# Patient Record
Sex: Female | Born: 1980 | Race: White | Hispanic: No | Marital: Married | Smoking: Former smoker
Health system: Southern US, Community
[De-identification: ages and names within clinical notes are randomized; demographics above are authoritative.]

## PROBLEM LIST (undated history)

## (undated) DIAGNOSIS — M545 Low back pain, unspecified: Secondary | ICD-10-CM

## (undated) DIAGNOSIS — F909 Attention-deficit hyperactivity disorder, unspecified type: Secondary | ICD-10-CM

## (undated) DIAGNOSIS — G629 Polyneuropathy, unspecified: Secondary | ICD-10-CM

## (undated) DIAGNOSIS — M1990 Unspecified osteoarthritis, unspecified site: Secondary | ICD-10-CM

## (undated) DIAGNOSIS — M436 Torticollis: Secondary | ICD-10-CM

## (undated) DIAGNOSIS — G43909 Migraine, unspecified, not intractable, without status migrainosus: Secondary | ICD-10-CM

## (undated) HISTORY — DX: Low back pain: M54.5

## (undated) HISTORY — DX: Attention-deficit hyperactivity disorder, unspecified type: F90.9

## (undated) HISTORY — DX: Low back pain, unspecified: M54.50

## (undated) HISTORY — PX: CYSTOSCOPY: SUR368

---

## 2004-07-26 ENCOUNTER — Emergency Department: Payer: Self-pay | Admitting: Emergency Medicine

## 2004-10-08 ENCOUNTER — Ambulatory Visit: Payer: Self-pay | Admitting: Specialist

## 2005-07-30 ENCOUNTER — Encounter: Admission: RE | Admit: 2005-07-30 | Discharge: 2005-07-30 | Payer: Self-pay | Admitting: Unknown Physician Specialty

## 2005-11-25 ENCOUNTER — Ambulatory Visit: Payer: Self-pay | Admitting: Pain Medicine

## 2005-12-01 ENCOUNTER — Ambulatory Visit: Payer: Self-pay | Admitting: Pain Medicine

## 2005-12-02 ENCOUNTER — Ambulatory Visit: Payer: Self-pay | Admitting: Pain Medicine

## 2005-12-10 ENCOUNTER — Ambulatory Visit: Payer: Self-pay | Admitting: Pain Medicine

## 2005-12-21 ENCOUNTER — Observation Stay: Payer: Self-pay

## 2005-12-21 ENCOUNTER — Other Ambulatory Visit: Payer: Self-pay

## 2009-09-25 ENCOUNTER — Inpatient Hospital Stay: Payer: Self-pay | Admitting: Psychiatry

## 2010-07-13 HISTORY — PX: CERVICAL FUSION: SHX112

## 2010-08-07 ENCOUNTER — Ambulatory Visit: Payer: Self-pay | Admitting: Internal Medicine

## 2010-08-11 ENCOUNTER — Ambulatory Visit: Payer: Self-pay | Admitting: Unknown Physician Specialty

## 2010-08-12 ENCOUNTER — Ambulatory Visit: Payer: Self-pay | Admitting: Unknown Physician Specialty

## 2011-03-10 ENCOUNTER — Emergency Department: Payer: Self-pay | Admitting: *Deleted

## 2013-10-05 ENCOUNTER — Ambulatory Visit: Payer: Self-pay | Admitting: Adult Health

## 2013-12-29 ENCOUNTER — Other Ambulatory Visit: Payer: Self-pay | Admitting: Internal Medicine

## 2013-12-29 ENCOUNTER — Ambulatory Visit (INDEPENDENT_AMBULATORY_CARE_PROVIDER_SITE_OTHER)
Admission: RE | Admit: 2013-12-29 | Discharge: 2013-12-29 | Disposition: A | Discharge status: Home / Self Care | Payer: Self-pay | Source: Ambulatory Visit | Attending: Internal Medicine | Admitting: Internal Medicine

## 2013-12-29 DIAGNOSIS — M79609 Pain in unspecified limb: Secondary | ICD-10-CM

## 2013-12-29 DIAGNOSIS — M79632 Pain in left forearm: Secondary | ICD-10-CM

## 2013-12-29 MED ORDER — TRAMADOL HCL 50 MG PO TABS: 50.0000 mg | ORAL_TABLET | Freq: Three times a day (TID) | ORAL | Status: DC | PRN

## 2014-07-02 ENCOUNTER — Other Ambulatory Visit: Payer: Self-pay | Admitting: Neurosurgery

## 2014-07-02 DIAGNOSIS — Z981 Arthrodesis status: Secondary | ICD-10-CM

## 2014-07-02 DIAGNOSIS — M4722 Other spondylosis with radiculopathy, cervical region: Secondary | ICD-10-CM

## 2014-07-12 ENCOUNTER — Ambulatory Visit
Admission: RE | Admit: 2014-07-12 | Discharge: 2014-07-12 | Disposition: A | Discharge status: Home / Self Care | Payer: BC Managed Care – PPO | Source: Ambulatory Visit | Attending: Neurosurgery | Admitting: Neurosurgery

## 2014-07-12 DIAGNOSIS — Z981 Arthrodesis status: Secondary | ICD-10-CM

## 2014-07-12 DIAGNOSIS — M4722 Other spondylosis with radiculopathy, cervical region: Secondary | ICD-10-CM

## 2014-08-16 ENCOUNTER — Other Ambulatory Visit: Payer: Self-pay | Admitting: Nurse Practitioner

## 2014-08-16 MED ORDER — GABAPENTIN 300 MG PO CAPS: 300.0000 mg | ORAL_CAPSULE | Freq: Three times a day (TID) | ORAL | Status: DC

## 2014-10-26 ENCOUNTER — Other Ambulatory Visit: Payer: Self-pay | Admitting: Internal Medicine

## 2014-10-26 MED ORDER — CIPROFLOXACIN HCL 500 MG PO TABS: 500.0000 mg | ORAL_TABLET | Freq: Two times a day (BID) | ORAL | Status: DC

## 2014-12-18 ENCOUNTER — Telehealth: Payer: Self-pay

## 2014-12-18 NOTE — Telephone Encounter (Signed)
Spoke with patient and she said she did not missed an appt because her next visit wasn't until some time in June. She will call back to schedule an appt.

## 2014-12-18 NOTE — Telephone Encounter (Signed)
Denied, no showed for last appt; needs visit

## 2014-12-26 ENCOUNTER — Ambulatory Visit: Payer: Self-pay | Admitting: Family Medicine

## 2014-12-26 ENCOUNTER — Encounter: Payer: Self-pay | Admitting: Family Medicine

## 2014-12-26 ENCOUNTER — Ambulatory Visit (INDEPENDENT_AMBULATORY_CARE_PROVIDER_SITE_OTHER): Payer: BLUE CROSS/BLUE SHIELD | Admitting: Family Medicine

## 2014-12-26 VITALS — BP 116/76 | HR 83 | Temp 98.4°F | Wt 139.0 lb

## 2014-12-26 DIAGNOSIS — M5412 Radiculopathy, cervical region: Secondary | ICD-10-CM | POA: Diagnosis not present

## 2014-12-26 DIAGNOSIS — F900 Attention-deficit hyperactivity disorder, predominantly inattentive type: Secondary | ICD-10-CM | POA: Diagnosis not present

## 2014-12-26 DIAGNOSIS — Z3042 Encounter for surveillance of injectable contraceptive: Secondary | ICD-10-CM | POA: Diagnosis not present

## 2014-12-26 DIAGNOSIS — Z30013 Encounter for initial prescription of injectable contraceptive: Secondary | ICD-10-CM

## 2014-12-26 DIAGNOSIS — F909 Attention-deficit hyperactivity disorder, unspecified type: Secondary | ICD-10-CM

## 2014-12-26 DIAGNOSIS — M545 Low back pain, unspecified: Secondary | ICD-10-CM | POA: Insufficient documentation

## 2014-12-26 LAB — PREGNANCY, URINE: Preg Test, Ur: NEGATIVE

## 2014-12-26 MED ORDER — MEDROXYPROGESTERONE ACETATE 150 MG/ML IM SUSP
150.0000 mg | Freq: Once | INTRAMUSCULAR | Status: AC
  Administered 2014-12-26: 150 mg via INTRAMUSCULAR

## 2014-12-26 MED ORDER — AMPHETAMINE-DEXTROAMPHETAMINE 10 MG PO TABS: 10.0000 mg | ORAL_TABLET | Freq: Three times a day (TID) | ORAL | Status: DC

## 2014-12-26 MED ORDER — GABAPENTIN 100 MG PO CAPS: ORAL_CAPSULE | ORAL | Status: DC

## 2014-12-26 NOTE — Assessment & Plan Note (Signed)
Continue current meds; refills given today

## 2014-12-26 NOTE — Progress Notes (Signed)
BP 116/76 mmHg  Pulse 83  Temp(Src) 98.4 F (36.9 C)  Wt 139 lb (63.05 kg)  SpO2 100%   Subjective:    Patient ID: Amber Santana, female    DOB: 1981-03-12, 34 y.o.   MRN: [id number]  HPI: Amber Santana is a 34 y.o. female  Chief Complaint  Patient presents with  . ADHD  . Depo   ADHD On medication except has run out; can tell a big difference, trouble concentrating, in other people's conversation No tics, no tremors, no headaches, no internal anxiety She has taken this medicine for a long time Satisfied with current dose Her daughter took med for ADHD once but it cause abd pain so she can't take it  Depo use for contraception She is pleased with how this is working; urine pregnancy test today was negative  She says the gabapentin is working really well she'd like to stay on that; she takes 100 mg in the morning and 300 mg at bedtime; she is not eager to have surgery  Relevant past medical, surgical, family and social history reviewed and updated as indicated. Interim medical history since our last visit reviewed. Allergies and medications reviewed and updated.  Review of Systems  Cardiovascular: Negative for palpitations.  Genitourinary: Negative for vaginal bleeding.  Neurological: Negative for tremors and headaches.  Psychiatric/Behavioral: The patient is not nervous/anxious.    Per HPI unless specifically indicated above     Objective:    BP 116/76 mmHg  Pulse 83  Temp(Src) 98.4 F (36.9 C)  Wt 139 lb (63.05 kg)  SpO2 100%  Wt Readings from Last 3 Encounters:  12/26/14 139 lb (63.05 kg)  10/05/14 137 lb (62.143 kg)    Physical Exam  Constitutional: She appears well-developed and well-nourished. No distress.  HENT:  Head: Normocephalic and atraumatic.  Eyes: EOM are normal. No scleral icterus.  Neck: No thyromegaly present.  Surgical scar left anterior neck  Cardiovascular: Normal rate, regular rhythm and normal heart sounds.   No murmur  heard. Pulmonary/Chest: Effort normal and breath sounds normal. No respiratory distress. She has no wheezes.  Abdominal: Soft. She exhibits no distension.  Musculoskeletal: Normal range of motion. She exhibits no edema.  Neurological: She is alert. She exhibits normal muscle tone.  Normal gait, no loss of muscle bulk in RUE  Skin: Skin is warm and dry. She is not diaphoretic. No pallor.  Psychiatric: She has a normal mood and affect. Her behavior is normal. Judgment and thought content normal.    No results found for this or any previous visit.    Assessment & Plan:   Problem List Items Addressed This Visit      Nervous and Auditory   Cervical radiculopathy, chronic   Relevant Medications   amphetamine-dextroamphetamine (ADDERALL) 10 MG tablet   amphetamine-dextroamphetamine (ADDERALL) 10 MG tablet   amphetamine-dextroamphetamine (ADDERALL) 10 MG tablet   gabapentin (NEURONTIN) 100 MG capsule     Other   Adult ADHD (attention deficit hyperactivity disorder)    Continue current meds; refills given today      Depo-Provera contraceptive status    Other Visit Diagnoses    Surveillance of contraceptive injection    -  Primary    Relevant Medications    medroxyPROGESTERone (DEPO-PROVERA) injection 150 mg    Other Relevant Orders    Pregnancy, urine        Follow up plan: Return in about 3 months (around 03/28/2015) for medicines, also for physical when convenient.

## 2014-12-26 NOTE — Patient Instructions (Signed)
Return in 3 months for medication and Depo injection Return for complete physical in the next few months (separate appointments, sorry)

## 2015-03-14 ENCOUNTER — Other Ambulatory Visit: Payer: Self-pay | Admitting: Internal Medicine

## 2015-03-14 DIAGNOSIS — H6692 Otitis media, unspecified, left ear: Secondary | ICD-10-CM

## 2015-03-14 MED ORDER — PREDNISONE 10 MG PO TABS: ORAL_TABLET | ORAL | Status: DC

## 2015-03-14 MED ORDER — AMOXICILLIN-POT CLAVULANATE 875-125 MG PO TABS: 1.0000 | ORAL_TABLET | Freq: Two times a day (BID) | ORAL | Status: DC

## 2015-03-20 ENCOUNTER — Encounter: Payer: Self-pay | Admitting: Family Medicine

## 2015-03-20 ENCOUNTER — Ambulatory Visit (INDEPENDENT_AMBULATORY_CARE_PROVIDER_SITE_OTHER): Payer: BLUE CROSS/BLUE SHIELD | Admitting: Family Medicine

## 2015-03-20 VITALS — BP 126/83 | HR 76 | Temp 97.8°F | Ht 64.5 in | Wt 139.0 lb

## 2015-03-20 DIAGNOSIS — Z3042 Encounter for surveillance of injectable contraceptive: Secondary | ICD-10-CM | POA: Diagnosis not present

## 2015-03-20 DIAGNOSIS — F900 Attention-deficit hyperactivity disorder, predominantly inattentive type: Secondary | ICD-10-CM | POA: Diagnosis not present

## 2015-03-20 DIAGNOSIS — F909 Attention-deficit hyperactivity disorder, unspecified type: Secondary | ICD-10-CM

## 2015-03-20 DIAGNOSIS — M5412 Radiculopathy, cervical region: Secondary | ICD-10-CM | POA: Diagnosis not present

## 2015-03-20 MED ORDER — AMPHETAMINE-DEXTROAMPHETAMINE 12.5 MG PO TABS: 12.5000 mg | ORAL_TABLET | Freq: Three times a day (TID) | ORAL | Status: DC

## 2015-03-20 MED ORDER — MEDROXYPROGESTERONE ACETATE 150 MG/ML IM SUSP
150.0000 mg | Freq: Once | INTRAMUSCULAR | Status: AC
  Administered 2015-03-20: 150 mg via INTRAMUSCULAR

## 2015-03-20 NOTE — Assessment & Plan Note (Signed)
Doing well with current regimen of gabapentin

## 2015-03-20 NOTE — Patient Instructions (Addendum)
Be aware that consuming caffeine plus taking the stimulants is a BAD idea, so cut out the energy drinks The stimulant medicine carries a small but possible risk of heart attack, and combining that with Sudafed and/or energy drinks only increases your risk Increase the Adderall and see how that works for you, call me in a couple of weeks with an update Have your blood pressure and heart rate and weight checked at your office once a week for the next four weeks and let me know what those readings are before your next refill is due Return in 13 weeks for Depo and ADHD follow-up Flu shots are recommended every fall

## 2015-03-20 NOTE — Progress Notes (Signed)
BP 126/83 mmHg  Pulse 76  Temp(Src) 97.8 F (36.6 C)  Ht 5' 4.5" (1.638 m)  Wt 139 lb (63.05 kg)  BMI 23.50 kg/m2  SpO2 98%   Subjective:    Patient ID: Amber Santana, female    DOB: Aug 22, 1980, 34 y.o.   MRN: [id number]  HPI: Amber Santana is a 34 y.o. female  Chief Complaint  Patient presents with  . ADHD  . Contraception    Due for Depo injection    She is doing well on her medicine; there are things she can do better on her medicine; staying on task definitely is helped by being on medicine, able to focus and stay on task; no adverse side effects; weight stable; no headaches, not jittery; she thinks she needs the dose increased; she does not know if she has gotten used to the same dose, has been on the dose for years; not able to focus as well or for as long; ; tongue swelling, but no thrush, no throat closing up, no trouble breathing  She took sudafed today; was in the bed all weekend with sinus junk; throat irritated; sinus congestion; never had strep throat; 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 Interim medical history since our last visit reviewed. Allergies and medications reviewed and updated.  Review of Systems Per HPI unless specifically indicated above     Objective:    BP 126/83 mmHg  Pulse 76  Temp(Src) 97.8 F (36.6 C)  Ht 5' 4.5" (1.638 m)  Wt 139 lb (63.05 kg)  BMI 23.50 kg/m2  SpO2 98%  Wt Readings from Last 3 Encounters:  03/20/15 139 lb (63.05 kg)  12/26/14 139 lb (63.05 kg)  10/05/14 137 lb (62.143 kg)    Physical Exam   Constitutional: She appears well-developed and well-nourished. No distress.  HENT:  Head: Normocephalic and atraumatic.  Right Ear: Hearing, tympanic membrane, external ear and ear canal normal. Tympanic membrane is not erythematous. No middle ear effusion.  Left Ear: Hearing, tympanic membrane, external ear and ear canal normal. Tympanic membrane is not erythematous.  No middle ear effusion.  Nose: No rhinorrhea.  Mouth/Throat: Oropharynx is clear and moist and mucous membranes are normal. No oral lesions. No oropharyngeal exudate, posterior oropharyngeal edema or posterior oropharyngeal erythema.  No swelling of tongue  Eyes: EOM are normal. No scleral icterus.  Neck: No thyromegaly present.  Cardiovascular: Normal rate, regular rhythm and normal heart sounds.   No murmur heard. Pulmonary/Chest: Effort normal and breath sounds normal. No respiratory distress. She has no wheezes.  Abdominal: Soft. Bowel sounds are normal. She exhibits no distension.  Musculoskeletal: Normal range of motion. She exhibits no edema.  Neurological: She is alert. She displays no tremor. She exhibits normal muscle tone. Gait normal.  Reflex Scores:      Patellar reflexes are 2+ on the right side and 2+ on the left side. Skin: Skin is warm and dry. No ecchymosis and no rash noted. She is not diaphoretic. No erythema. No pallor.  Psychiatric: She has a normal mood and affect. Her behavior is normal. Judgment and  thought content normal.    Results for orders placed or performed in visit on 12/26/14  Pregnancy, urine  Result Value Ref Range   Preg Test, Ur Negative Negative      Assessment & Plan:   Problem List Items Addressed This Visit      Nervous and Auditory   Cervical radiculopathy, chronic    Doing well with current regimen of gabapentin      Relevant Medications   amphetamine-dextroamphetamine (ADDERALL) 12.5 MG tablet     Other   Adult ADHD (attention deficit hyperactivity disorder) -  Primary    No adverse effects of medicine; patient's use of energy drinks suggest she may be attempting to self-medicate; discussed risk of heart attack when combining stimulants plus sudafed plus energy drinks; avoid excessive caffeine and any other stimulant whlie on medicine; I will increase her adderall to 12.5 mg TID; patient to monitor her BP, pulse, weight (she works at [employer] office); once a week for 4 weeks and contact me with results; if working well at that time without adversely affecting vitals, will continue until next appt in 13 weeks      Depo-Provera contraceptive status   Relevant Medications   medroxyPROGESTERone (DEPO-PROVERA) injection 150 mg      Not pharmacy shopping; patient may talk to pharmacist; may take Rx to another pharmacy in case sensitive to ingredient We reviewed all of her previous labs  Follow up plan: Return in about 13 weeks (around 06/19/2015) for depo and ADHD.

## 2015-03-20 NOTE — Assessment & Plan Note (Signed)
No adverse effects of medicine; patient's use of energy drinks suggest she may be attempting to self-medicate; discussed risk of heart attack when combining stimulants plus sudafed plus energy drinks; avoid excessive caffeine and any other stimulant whlie on medicine; I will increase her adderall to 12.5 mg TID; patient to monitor her BP, pulse, weight (she works at [employer] office); once a week for 4 weeks and contact me with results; if working well at that time without adversely affecting vitals, will continue until next appt in 13 weeks

## 2015-04-18 ENCOUNTER — Telehealth: Payer: Self-pay | Admitting: Family Medicine

## 2015-04-18 NOTE — Telephone Encounter (Signed)
Pt called stated during her last visit Dr. Sherie Don increased her Aderrall. This is doing well. Stated she needs to pick up the other 2 prescriptions. Please call pt when this is ready for pick up. Thanks. Pt would like these done by tomorrow. Pt's husband is going to pick up the RX Gus Rankin)

## 2015-04-18 NOTE — Telephone Encounter (Signed)
Please see last office visit; she was supposed to call me with her weight, pulse, blood pressure readings; I need those first to make sure higher dose is appropriate and not causing harm Our agreement was that she wouldn't have to come back in to see [place] if she'd check those things and let me know

## 2015-04-18 NOTE — Telephone Encounter (Signed)
Routing to provider  

## 2015-04-19 MED ORDER — AMPHETAMINE-DEXTROAMPHETAMINE 12.5 MG PO TABS: 12.5000 mg | ORAL_TABLET | Freq: Three times a day (TID) | ORAL | Status: DC

## 2015-04-19 NOTE — Telephone Encounter (Signed)
Patient called back, her BP is 110/72, pulse is 76. Weight 141. She gets off work today at [employer] and would like to pick up her rx this afternoon.

## 2015-04-19 NOTE — Telephone Encounter (Signed)
Rx ready; thank you

## 2015-04-19 NOTE — Telephone Encounter (Signed)
Patient will have her vitals and weight checked and call me back with those readings.

## 2015-06-04 ENCOUNTER — Other Ambulatory Visit: Payer: Self-pay | Admitting: Family Medicine

## 2015-06-04 NOTE — Telephone Encounter (Signed)
Pt needs Adderal

## 2015-06-05 MED ORDER — AMPHETAMINE-DEXTROAMPHETAMINE 12.5 MG PO TABS: 12.5000 mg | ORAL_TABLET | Freq: Three times a day (TID) | ORAL | Status: DC

## 2015-06-05 NOTE — Telephone Encounter (Signed)
Dr. Laural BenesJohnson, I am off (vacation) today and patient is requesting refill of ADHD medicine I reviewed her last office visit and phone note She is compliant; no red flags I checked NCCSRS and the Rx written on 04/19/15 by me was filled 04/29/15; this is appropriate Would you mind providing a prescription for just 21 pills (enough for one week) and I'll provide further Rx when I return next week? Thank you so much

## 2015-06-05 NOTE — Telephone Encounter (Signed)
Routing to provider  

## 2015-06-11 MED ORDER — AMPHETAMINE-DEXTROAMPHETAMINE 12.5 MG PO TABS: 12.5000 mg | ORAL_TABLET | Freq: Three times a day (TID) | ORAL | Status: DC

## 2015-06-11 NOTE — Telephone Encounter (Signed)
New Rx written; signed and up front

## 2015-06-11 NOTE — Addendum Note (Signed)
Addended by: Chisum Habenicht, Janit BernMELINDA P on: 06/11/2015 01:05 PM   Modules accepted: Orders

## 2015-06-20 ENCOUNTER — Ambulatory Visit (INDEPENDENT_AMBULATORY_CARE_PROVIDER_SITE_OTHER): Payer: BLUE CROSS/BLUE SHIELD

## 2015-06-20 DIAGNOSIS — Z308 Encounter for other contraceptive management: Secondary | ICD-10-CM | POA: Diagnosis not present

## 2015-06-20 MED ORDER — MEDROXYPROGESTERONE ACETATE 150 MG/ML IM SUSP
150.0000 mg | Freq: Once | INTRAMUSCULAR | Status: AC
  Administered 2015-06-20: 150 mg via INTRAMUSCULAR

## 2015-06-21 ENCOUNTER — Encounter: Payer: BLUE CROSS/BLUE SHIELD | Admitting: Family Medicine

## 2015-09-05 ENCOUNTER — Ambulatory Visit: Payer: BLUE CROSS/BLUE SHIELD

## 2015-09-05 ENCOUNTER — Encounter: Payer: Self-pay | Admitting: Family Medicine

## 2015-09-05 ENCOUNTER — Ambulatory Visit (INDEPENDENT_AMBULATORY_CARE_PROVIDER_SITE_OTHER): Payer: Managed Care, Other (non HMO) | Admitting: Family Medicine

## 2015-09-05 VITALS — BP 121/75 | HR 73 | Temp 98.8°F | Ht 64.25 in | Wt 141.6 lb

## 2015-09-05 DIAGNOSIS — N898 Other specified noninflammatory disorders of vagina: Secondary | ICD-10-CM | POA: Diagnosis not present

## 2015-09-05 DIAGNOSIS — N760 Acute vaginitis: Secondary | ICD-10-CM | POA: Diagnosis not present

## 2015-09-05 DIAGNOSIS — Z3042 Encounter for surveillance of injectable contraceptive: Secondary | ICD-10-CM

## 2015-09-05 DIAGNOSIS — M5412 Radiculopathy, cervical region: Secondary | ICD-10-CM | POA: Diagnosis not present

## 2015-09-05 DIAGNOSIS — F900 Attention-deficit hyperactivity disorder, predominantly inattentive type: Secondary | ICD-10-CM | POA: Diagnosis not present

## 2015-09-05 DIAGNOSIS — Z6379 Other stressful life events affecting family and household: Secondary | ICD-10-CM

## 2015-09-05 DIAGNOSIS — F909 Attention-deficit hyperactivity disorder, unspecified type: Secondary | ICD-10-CM

## 2015-09-05 DIAGNOSIS — A499 Bacterial infection, unspecified: Secondary | ICD-10-CM | POA: Diagnosis not present

## 2015-09-05 DIAGNOSIS — N889 Noninflammatory disorder of cervix uteri, unspecified: Secondary | ICD-10-CM | POA: Insufficient documentation

## 2015-09-05 DIAGNOSIS — Z124 Encounter for screening for malignant neoplasm of cervix: Secondary | ICD-10-CM | POA: Insufficient documentation

## 2015-09-05 DIAGNOSIS — B9689 Other specified bacterial agents as the cause of diseases classified elsewhere: Secondary | ICD-10-CM

## 2015-09-05 LAB — WET PREP FOR TRICH, YEAST, CLUE
Clue Cell Exam: POSITIVE — AB
Trichomonas Exam: NEGATIVE
YEAST EXAM: NEGATIVE

## 2015-09-05 MED ORDER — METRONIDAZOLE 500 MG PO TABS: 500.0000 mg | ORAL_TABLET | Freq: Two times a day (BID) | ORAL | Status: DC

## 2015-09-05 MED ORDER — AMPHETAMINE-DEXTROAMPHETAMINE 12.5 MG PO TABS: 12.5000 mg | ORAL_TABLET | Freq: Three times a day (TID) | ORAL | Status: DC

## 2015-09-05 MED ORDER — GABAPENTIN 100 MG PO CAPS: ORAL_CAPSULE | ORAL | Status: DC

## 2015-09-05 NOTE — Assessment & Plan Note (Addendum)
Patient was not scheduled for a physical, but pap smear due; thin prep collected; however, abnormal appearance of cervix and presence of BV, erythema, discharge may render sampling error; refer to GYN

## 2015-09-05 NOTE — Assessment & Plan Note (Signed)
Refer to GYN; thin prep collected today but she had BV and fair amount of erythema, discharge, so would suggest to GYN that they consider repeating the pap smear if felt indicated, as infection or inflammation could have interfered with sampling

## 2015-09-05 NOTE — Progress Notes (Signed)
BP 121/75 mmHg  Pulse 73  Temp(Src) 98.8 F (37.1 C)  Ht 5' 4.25" (1.632 m)  Wt 141 lb 9.6 oz (64.229 kg)  BMI 24.12 kg/m2  SpO2 98%   Subjective:    Patient ID: Amber Santana, female    DOB: [birth year], 35 y.o.   MRN: [id number]  HPI: Amber Santana is a 35 y.o. female  Chief Complaint  Patient presents with  . Follow-up  . Contraception    Depo   Last pap smear was May 2013; health maintenance is now showing that is it overdue; she reports that she has never had an abnormal one; she thinks she might have a yeast infection; husband noticed discharge; some itching and burning; no periods at all on Depo; has had thick white cottage cheese discharge she reports; last pap smear report reviewed from 2013, no HPV done; no issues with headache or weight fluctuations; she has been under a lot of stress lately; some weight gain but stress  Last depo was given Dec 8th and next injection due between Feb 23rd and March 9th per Depo calendar  She has been under a lot of stress; her daughter is going to finish up competitive cheerleading in May and this end of an era is difficult for patient to comes to term with; she has a son who is a Holiday representative in high school; they tried her on lexapro, did not like how it make her feel; sleep is a whole other problem she explains  The neurontin is working well, that is a wonder drug she reports for her cervical radiculopathy  The adderall is doing well, wants to stay on same dose; long-standing dx and treatment of ADHD; she had a change in insurance and was not able to get it covered for a few months  Relevant past medical, surgical, family and social history reviewed and updated as indicated. Interim medical history since our last visit reviewed. Allergies and medications reviewed and updated.  Review of Systems Per HPI unless specifically indicated above     Objective:    BP 121/75 mmHg  Pulse 73  Temp(Src) 98.8 F (37.1 C)  Ht 5' 4.25" (1.632  m)  Wt 141 lb 9.6 oz (64.229 kg)  BMI 24.12 kg/m2  SpO2 98%  Wt Readings from Last 3 Encounters:  09/05/15 141 lb 9.6 oz (64.229 kg)  03/20/15 139 lb (63.05 kg)  12/26/14 139 lb (63.05 kg)    Physical Exam  Constitutional: She appears well-developed and well-nourished. No distress.  HENT:  Head: Normocephalic and atraumatic.  Eyes: EOM are normal. No scleral icterus.  Neck: No thyromegaly present.  Surgical scar left side of neck  Cardiovascular: Normal rate, regular rhythm and normal heart sounds.   No murmur heard. Pulmonary/Chest: Effort normal and breath sounds normal. No respiratory distress. She has no wheezes.  Abdominal: Soft. Bowel sounds are normal. She exhibits no distension.  Genitourinary: There is no rash on the right labia. There is no rash on the left labia. No bleeding in the vagina. Vaginal discharge (thin, fishy odor; not white or clumpy) found.  Thin prep collected; cervix appeared abnormal; erythema, irregular surface and tissue consistency from about 1 o'clock to 5 o'clock positions; papular or cystic lesions at about 1 and 4 o'clock  Musculoskeletal: Normal range of motion. She exhibits no edema.  Neurological: She is alert. She exhibits normal muscle tone.  Skin: Skin is warm and dry. She is not diaphoretic. No pallor.  Psychiatric: Her behavior  is normal. Judgment and thought content normal. She exhibits a depressed mood.  Briefly tearful when discussing the end of her daughter's cheerleading career   Results for orders placed or performed in visit on 09/05/15  WET PREP FOR TRICH, YEAST, CLUE  Result Value Ref Range   Trichomonas Exam Negative Negative   Yeast Exam Negative Negative   Clue Cell Exam Positive (A) Negative      Assessment & Plan:   Problem List Items Addressed This Visit      Nervous and Auditory   Cervical radiculopathy, chronic    She is doing well with gabapentin; refills provided      Relevant Medications   gabapentin  (NEURONTIN) 100 MG capsule   amphetamine-dextroamphetamine (ADDERALL) 12.5 MG tablet     Genitourinary   Abnormal appearance of cervix    Refer to GYN; thin prep collected today but she had BV and fair amount of erythema, discharge, so would suggest to GYN that they consider repeating the pap smear if felt indicated, as infection or inflammation could have interfered with sampling      Relevant Orders   Ambulatory referral to Gynecology     Other   Adult ADHD (attention deficit hyperactivity disorder) - Primary (Chronic)    Doing well on current medicine; NCCSRS reviewed; no other prescribers; no early refills, no concern for misuse or diversion; 3 months of prescriptions written out with appropriate fill on or after dates; return for next prescriptions and follow=up in 3 months      Depo-Provera contraceptive status (Chronic)    Abnormal appearance to cervix; patient may have the depo injection any time between today and March 9th; I talked with patient about the length of time that she has been on Depo (many years), risk of osteoporosis, and suggested she talk with GYN when she goes about whether Depo shot appropriate with cervical abnormalities and to consider other method of birth control      Relevant Orders   Ambulatory referral to Gynecology   Cervical cancer screening    Patient was not scheduled for a physical, but pap smear due; thin prep collected; however, abnormal appearance of cervix and presence of BV, erythema, discharge may render sampling error; refer to GYN      Relevant Orders   Pap liquid-based and HPV (high risk)   Vaginal discharge    Wet prep collected today; showed BV; diagnosed dx with patient, treated      Relevant Orders   WET PREP FOR TRICH, YEAST, CLUE (Completed)   Stressful life event affecting family    Patient dealing with end of daughter's cheerleading career; affecting her personally; she is not interested in medicine at this time; suggested  counseling, relaxation response, see AVS; she may call if needed       Other Visit Diagnoses    Bacterial vaginosis        Relevant Medications    metroNIDAZOLE (FLAGYL) 500 MG tablet       Follow up plan: Return in about 3 months (around 12/03/2015) for medication follow-up.  An after-visit summary was printed and given to the patient at check-out.  Please see the patient instructions which may contain other information and recommendations beyond what is mentioned above in the assessment and plan.  Meds ordered this encounter  Medications  . DISCONTD: amphetamine-dextroamphetamine (ADDERALL) 12.5 MG tablet    Sig: Take 1 tablet by mouth 3 (three) times daily.    Dispense:  90 tablet  Refill:  0  . gabapentin (NEURONTIN) 100 MG capsule    Sig: One by mouth every morning, and three by mouth at bedtime    Dispense:  120 capsule    Refill:  5  . metroNIDAZOLE (FLAGYL) 500 MG tablet    Sig: Take 1 tablet (500 mg total) by mouth 2 (two) times daily. No alcohol or cold medicines that contain alcohol while on this med    Dispense:  14 tablet    Refill:  0  . DISCONTD: amphetamine-dextroamphetamine (ADDERALL) 12.5 MG tablet    Sig: Take 1 tablet by mouth 3 (three) times daily.    Dispense:  90 tablet    Refill:  0    Fill on or after October 04, 2015  . amphetamine-dextroamphetamine (ADDERALL) 12.5 MG tablet    Sig: Take 1 tablet by mouth 3 (three) times daily.    Dispense:  90 tablet    Refill:  0    Fill on or after November 03, 2015   Orders Placed This Encounter  Procedures  . WET PREP FOR TRICH, YEAST, CLUE  . Ambulatory referral to Gynecology

## 2015-09-05 NOTE — Assessment & Plan Note (Signed)
Wet prep collected today; showed BV; diagnosed dx with patient, treated

## 2015-09-05 NOTE — Assessment & Plan Note (Signed)
She is doing well with gabapentin; refills provided

## 2015-09-05 NOTE — Assessment & Plan Note (Signed)
Patient dealing with end of daughter's cheerleading career; affecting her personally; she is not interested in medicine at this time; suggested counseling, relaxation response, see AVS; she may call if needed

## 2015-09-05 NOTE — Patient Instructions (Addendum)
We'll have you see the gynecologist  Start the new medicine   I'll be moving to another practice, [facility], on October 14, 2015 I'll be here until October 11, 2015 You are welcome to either follow me there for ongoing care, or you can stay here at [facility] and see Dr. Olevia Perches or our nurse practitioner Gabriel Cirri   Steps to Elicit the Relaxation Response The following is the technique reprinted with permission from Dr. Billy Fischer book The Relaxation Response pages 162-163 1. Sit quietly in a comfortable position. 2. Close your eyes. 3. Deeply relax all your muscles,  beginning at your feet and progressing up to your face.  Keep them relaxed. 4. Breathe through your nose.  Become aware of your breathing.  As you breathe out, say the word, "one"*,  silently to yourself. For example,  breathe in ... out, "one",- in .. out, "one", etc.  Breathe easily and naturally. 5. Continue for 10 to 20 minutes.  You may open your eyes to check the time, but do not use an alarm.  When you finish, sit quietly for several minutes,  at first with your eyes closed and later with your eyes opened.  Do not stand up for a few minutes. 6. Do not worry about whether you are successful  in achieving a deep level of relaxation.  Maintain a passive attitude and permit relaxation to occur at its own pace.  When distracting thoughts occur,  try to ignore them by not dwelling upon them  and return to repeating "one."  With practice, the response should come with little effort.  Practice the technique once or twice daily,  but not within two hours after any meal,  since the digestive processes seem to interfere with  the elicitation of the Relaxation Response. * It is better to use a soothing, mellifluous sound, preferably with no meaning. or association, to avoid stimulation of unnecessary thoughts - a mantra.

## 2015-09-05 NOTE — Assessment & Plan Note (Signed)
Doing well on current medicine; NCCSRS reviewed; no other prescribers; no early refills, no concern for misuse or diversion; 3 months of prescriptions written out with appropriate fill on or after dates; return for next prescriptions and follow=up in 3 months

## 2015-09-05 NOTE — Assessment & Plan Note (Signed)
Abnormal appearance to cervix; patient may have the depo injection any time between today and March 9th; I talked with patient about the length of time that she has been on Depo (many years), risk of osteoporosis, and suggested she talk with GYN when she goes about whether Depo shot appropriate with cervical abnormalities and to consider other method of birth control

## 2015-09-11 ENCOUNTER — Telehealth: Payer: Self-pay

## 2015-09-11 NOTE — Telephone Encounter (Signed)
Patient notified. She has appointment with GYN next week.

## 2015-09-11 NOTE — Telephone Encounter (Signed)
She would like her pap results, they have not crossed over yet because the HPV is still pending. I had the lab print out her prelim results and they are in you blue box.

## 2015-09-11 NOTE — Telephone Encounter (Signed)
Please let her know the first part came back okay but HPV still pending  She will still need to see GYN

## 2015-09-12 ENCOUNTER — Other Ambulatory Visit: Payer: Self-pay | Admitting: Family Medicine

## 2015-09-12 LAB — PAP LB AND HPV HIGH-RISK
HPV, HIGH-RISK: NEGATIVE
PAP Smear Comment: 0

## 2015-09-12 MED ORDER — FLUCONAZOLE 150 MG PO TABS: 150.0000 mg | ORAL_TABLET | Freq: Once | ORAL | Status: DC

## 2015-10-15 ENCOUNTER — Ambulatory Visit (INDEPENDENT_AMBULATORY_CARE_PROVIDER_SITE_OTHER): Payer: Managed Care, Other (non HMO)

## 2015-10-15 DIAGNOSIS — Z3040 Encounter for surveillance of contraceptives, unspecified: Secondary | ICD-10-CM | POA: Diagnosis not present

## 2015-10-15 LAB — PREGNANCY, URINE: Preg Test, Ur: NEGATIVE

## 2015-10-15 MED ORDER — MEDROXYPROGESTERONE ACETATE 150 MG/ML IM SUSP
150.0000 mg | INTRAMUSCULAR | Status: AC
  Administered 2015-10-15: 150 mg via INTRAMUSCULAR

## 2015-10-29 ENCOUNTER — Encounter: Payer: Managed Care, Other (non HMO) | Admitting: Obstetrics and Gynecology

## 2015-12-06 ENCOUNTER — Ambulatory Visit: Payer: Managed Care, Other (non HMO) | Admitting: Family Medicine

## 2015-12-17 ENCOUNTER — Ambulatory Visit
Admission: RE | Admit: 2015-12-17 | Discharge: 2015-12-17 | Disposition: A | Discharge status: Home / Self Care | Payer: Managed Care, Other (non HMO) | Source: Ambulatory Visit | Attending: Unknown Physician Specialty | Admitting: Unknown Physician Specialty

## 2015-12-17 ENCOUNTER — Ambulatory Visit (INDEPENDENT_AMBULATORY_CARE_PROVIDER_SITE_OTHER): Payer: Managed Care, Other (non HMO) | Admitting: Unknown Physician Specialty

## 2015-12-17 ENCOUNTER — Encounter: Payer: Self-pay | Admitting: Unknown Physician Specialty

## 2015-12-17 VITALS — BP 119/82 | HR 81 | Temp 98.5°F | Ht 64.0 in | Wt 144.4 lb

## 2015-12-17 DIAGNOSIS — Z3042 Encounter for surveillance of injectable contraceptive: Secondary | ICD-10-CM

## 2015-12-17 DIAGNOSIS — M5412 Radiculopathy, cervical region: Secondary | ICD-10-CM | POA: Diagnosis not present

## 2015-12-17 DIAGNOSIS — R208 Other disturbances of skin sensation: Secondary | ICD-10-CM | POA: Diagnosis not present

## 2015-12-17 DIAGNOSIS — R200 Anesthesia of skin: Secondary | ICD-10-CM

## 2015-12-17 DIAGNOSIS — M5441 Lumbago with sciatica, right side: Secondary | ICD-10-CM | POA: Insufficient documentation

## 2015-12-17 DIAGNOSIS — M5136 Other intervertebral disc degeneration, lumbar region: Secondary | ICD-10-CM | POA: Diagnosis not present

## 2015-12-17 DIAGNOSIS — F900 Attention-deficit hyperactivity disorder, predominantly inattentive type: Secondary | ICD-10-CM

## 2015-12-17 DIAGNOSIS — F909 Attention-deficit hyperactivity disorder, unspecified type: Secondary | ICD-10-CM

## 2015-12-17 MED ORDER — MEDROXYPROGESTERONE ACETATE 150 MG/ML IM SUSP
150.0000 mg | INTRAMUSCULAR | Status: AC
  Administered 2015-12-17 – 2016-09-10 (×3): 150 mg via INTRAMUSCULAR

## 2015-12-17 MED ORDER — AMPHETAMINE-DEXTROAMPHETAMINE 12.5 MG PO TABS: 12.5000 mg | ORAL_TABLET | Freq: Three times a day (TID) | ORAL | Status: DC

## 2015-12-17 NOTE — Assessment & Plan Note (Addendum)
Worse lately with saddle anesthesia and one bout of sciatica.  Will get a back x-ray

## 2015-12-17 NOTE — Assessment & Plan Note (Signed)
Stable, continue present medications.  Put pt on a 28 day cycle.

## 2015-12-17 NOTE — Progress Notes (Signed)
BP 119/82 mmHg  Pulse 81  Temp(Src) 98.5 F (36.9 C)  Ht 5\' 4"  (1.626 m)  Wt 144 lb 6.4 oz (65.499 kg)  BMI 24.77 kg/m2  SpO2 100%   Subjective:    Patient ID: Amber Santana, female    DOB: 12/18/1980, 35 y.o.   MRN: [id number]  HPI: Amber MeiersJulie M Steib is a 35 y.o. female  Chief Complaint  Patient presents with  . Follow-up  . ADHD  . Medication Refill   ADHD Feels it is working well.  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  She works for [employer].    Numbness Pt states she has some numbness in her buttocks and vaginal area that comes and goes.  States she feels it lightly most of the time and intensifies. States it just lasts a few seconds when it's strong but feels it strongly about 10 times/day.    Describes it as a very strong feeling of numbness.  Had her first bout of sciatica with low back pain radiating down right leg about 6 weeks ago.    She also has chronic neck pain with radiculopathy that seems to be relieved with Gabapentin.    Relevant past medical, surgical, family and social history reviewed and updated as indicated. Interim medical history since our last visit reviewed. Allergies and medications reviewed and updated.  Review of Systems  Per HPI unless specifically indicated above     Objective:    BP 119/82 mmHg  Pulse 81  Temp(Src) 98.5 F (36.9 C)  Ht 5\' 4"  (1.626 m)  Wt 144 lb 6.4 oz (65.499 kg)  BMI 24.77 kg/m2  SpO2 100%  Wt Readings from Last 3 Encounters:  12/17/15 144 lb 6.4 oz (65.499 kg)  09/05/15 141 lb 9.6 oz (64.229 kg)  03/20/15 139 lb (63.05 kg)    Physical Exam  Constitutional: She is oriented to person, place, and time. She appears well-developed and well-nourished. No distress.  HENT:  Head: Normocephalic and atraumatic.  Eyes: Conjunctivae and lids are normal. Right eye exhibits no discharge. Left eye exhibits no discharge. No scleral icterus.  Neck: Normal range of motion.  Neck supple. No JVD present. Carotid bruit is not present.  Cardiovascular: Normal rate, regular rhythm and normal heart sounds.   Pulmonary/Chest: Effort normal and breath sounds normal.  Abdominal: Normal appearance. There is no splenomegaly or hepatomegaly.  Musculoskeletal: Normal range of motion.  Neurological: She is alert and oriented to person, place, and time.  Skin: Skin is warm, dry and intact. No rash noted. No pallor.  Psychiatric: She has a normal mood and affect. Her behavior is normal. Judgment and thought content normal.    Results for orders placed or performed in visit on 10/15/15  Pregnancy, urine  Result Value Ref Range   Preg Test, Ur Negative Negative      Assessment & Plan:   Problem List Items Addressed This Visit      Unprioritized   Adult ADHD (attention deficit hyperactivity disorder) (Chronic)    Stable, continue present medications.  Put pt on a 28 day cycle.        Cervical radiculopathy, chronic   Relevant Medications   baclofen (LIORESAL) 10 MG tablet   amphetamine-dextroamphetamine (ADDERALL) 12.5 MG tablet   Lumbago - Primary    Worse lately with saddle anesthesia and one bout of sciatica.  Will get a back x-ray      Relevant  Medications   baclofen (LIORESAL) 10 MG tablet   Other Relevant Orders   DG Lumbar Spine Complete   Saddle anesthesia   Relevant Orders   DG Lumbar Spine Complete    Other Visit Diagnoses    Encounter for surveillance of injectable contraceptive        Relevant Medications    medroxyPROGESTERone (DEPO-PROVERA) injection 150 mg (Start on 12/17/2015  4:15 PM)        Follow up plan: Return in about 3 months (around 03/18/2016).

## 2015-12-18 ENCOUNTER — Telehealth: Payer: Self-pay | Admitting: Unknown Physician Specialty

## 2015-12-18 NOTE — Telephone Encounter (Signed)
Discussed x-ray with patient.  Refer to a neurosurgeon whenever pt wishes.  She wishes to "wait and see" what happens with her lower back and sciatica symptoms.  We will recheck in 3 months and she can call if she wants an earlier referral.

## 2015-12-23 ENCOUNTER — Encounter: Payer: Self-pay | Admitting: Unknown Physician Specialty

## 2016-01-03 ENCOUNTER — Other Ambulatory Visit: Payer: Self-pay | Admitting: Unknown Physician Specialty

## 2016-01-03 MED ORDER — AMPHETAMINE-DEXTROAMPHETAMINE 12.5 MG PO TABS: 12.5000 mg | ORAL_TABLET | Freq: Three times a day (TID) | ORAL | Status: DC

## 2016-02-05 ENCOUNTER — Other Ambulatory Visit: Payer: Self-pay | Admitting: Unknown Physician Specialty

## 2016-02-05 MED ORDER — AMPHETAMINE-DEXTROAMPHETAMINE 12.5 MG PO TABS: 12.5000 mg | ORAL_TABLET | Freq: Three times a day (TID) | ORAL | 0 refills | Status: DC

## 2016-02-14 ENCOUNTER — Other Ambulatory Visit: Payer: Self-pay | Admitting: Unknown Physician Specialty

## 2016-02-14 MED ORDER — AMPHETAMINE-DEXTROAMPHETAMINE 12.5 MG PO TABS: 12.5000 mg | ORAL_TABLET | Freq: Three times a day (TID) | ORAL | 0 refills | Status: DC

## 2016-03-05 ENCOUNTER — Encounter (HOSPITAL_COMMUNITY): Payer: Self-pay | Admitting: *Deleted

## 2016-03-05 ENCOUNTER — Emergency Department (HOSPITAL_COMMUNITY)
Admission: EM | Admit: 2016-03-05 | Discharge: 2016-03-05 | Disposition: A | Discharge status: Home / Self Care | Payer: Managed Care, Other (non HMO) | Attending: Emergency Medicine | Admitting: Emergency Medicine

## 2016-03-05 DIAGNOSIS — Z87891 Personal history of nicotine dependence: Secondary | ICD-10-CM | POA: Insufficient documentation

## 2016-03-05 DIAGNOSIS — R51 Headache: Secondary | ICD-10-CM | POA: Insufficient documentation

## 2016-03-05 DIAGNOSIS — R519 Headache, unspecified: Secondary | ICD-10-CM

## 2016-03-05 DIAGNOSIS — Z79899 Other long term (current) drug therapy: Secondary | ICD-10-CM | POA: Diagnosis not present

## 2016-03-05 DIAGNOSIS — F909 Attention-deficit hyperactivity disorder, unspecified type: Secondary | ICD-10-CM | POA: Diagnosis not present

## 2016-03-05 HISTORY — DX: Polyneuropathy, unspecified: G62.9

## 2016-03-05 HISTORY — DX: Migraine, unspecified, not intractable, without status migrainosus: G43.909

## 2016-03-05 LAB — CBC WITH DIFFERENTIAL/PLATELET
BASOS ABS: 0 10*3/uL (ref 0.0–0.1)
Basophils Relative: 0 %
EOS ABS: 0 10*3/uL (ref 0.0–0.7)
EOS PCT: 0 %
HCT: 42.3 % (ref 36.0–46.0)
Hemoglobin: 14.1 g/dL (ref 12.0–15.0)
LYMPHS PCT: 18 %
Lymphs Abs: 1.7 10*3/uL (ref 0.7–4.0)
MCH: 29.1 pg (ref 26.0–34.0)
MCHC: 33.3 g/dL (ref 30.0–36.0)
MCV: 87.2 fL (ref 78.0–100.0)
Monocytes Absolute: 0.6 10*3/uL (ref 0.1–1.0)
Monocytes Relative: 6 %
Neutro Abs: 7.6 10*3/uL (ref 1.7–7.7)
Neutrophils Relative %: 76 %
PLATELETS: 297 10*3/uL (ref 150–400)
RBC: 4.85 MIL/uL (ref 3.87–5.11)
RDW: 12.6 % (ref 11.5–15.5)
WBC: 9.9 10*3/uL (ref 4.0–10.5)

## 2016-03-05 LAB — I-STAT BETA HCG BLOOD, ED (MC, WL, AP ONLY)

## 2016-03-05 LAB — COMPREHENSIVE METABOLIC PANEL
ALBUMIN: 4.6 g/dL (ref 3.5–5.0)
ALT: 12 U/L — ABNORMAL LOW (ref 14–54)
AST: 17 U/L (ref 15–41)
Alkaline Phosphatase: 45 U/L (ref 38–126)
Anion gap: 11 (ref 5–15)
BILIRUBIN TOTAL: 0.6 mg/dL (ref 0.3–1.2)
BUN: 10 mg/dL (ref 6–20)
CHLORIDE: 108 mmol/L (ref 101–111)
CO2: 19 mmol/L — ABNORMAL LOW (ref 22–32)
Calcium: 9.1 mg/dL (ref 8.9–10.3)
Creatinine, Ser: 0.76 mg/dL (ref 0.44–1.00)
Glucose, Bld: 108 mg/dL — ABNORMAL HIGH (ref 65–99)
POTASSIUM: 4 mmol/L (ref 3.5–5.1)
SODIUM: 138 mmol/L (ref 135–145)
TOTAL PROTEIN: 7.3 g/dL (ref 6.5–8.1)

## 2016-03-05 MED ORDER — DIPHENHYDRAMINE HCL 50 MG/ML IJ SOLN
25.0000 mg | Freq: Once | INTRAMUSCULAR | Status: AC
  Administered 2016-03-05: 25 mg via INTRAVENOUS
  Filled 2016-03-05: qty 1

## 2016-03-05 MED ORDER — PROCHLORPERAZINE EDISYLATE 5 MG/ML IJ SOLN
10.0000 mg | Freq: Once | INTRAMUSCULAR | Status: AC
  Administered 2016-03-05: 10 mg via INTRAVENOUS
  Filled 2016-03-05: qty 2

## 2016-03-05 MED ORDER — BUPIVACAINE HCL (PF) 0.5 % IJ SOLN
50.0000 mL | Freq: Once | INTRAMUSCULAR | Status: AC
  Administered 2016-03-05: 50 mL
  Filled 2016-03-05: qty 60

## 2016-03-05 NOTE — ED Triage Notes (Addendum)
PT began experiencing R facial "heaviness" last Wed and R sided headache Thurs.  Was seen at [facility] on T[facility] who started her on steroids.  Denies improvement with steroids or aleve.  States hx of migraines, but this is not the same.  States fingers and toes turned blue.

## 2016-03-05 NOTE — ED Provider Notes (Signed)
[facility]-EMER[facility]ENCY DEPT Provider Note   CSN: [id number] Arrival date & time: 03/05/16  0806     History   Chief Complaint Chief Complaint  Patient presents with  . Headache    HPI Amber Santana is a 35 y.o. female.  The history is provided by the patient.  Headache   This is a recurrent problem. Episode onset: 1 week. The problem occurs constantly. The problem has not changed since onset.The headache is associated with nothing. The pain is located in the bilateral region. The quality of the pain is described as sharp and throbbing. The pain is moderate. The pain does not radiate. Pertinent negatives include no anorexia, no fever, no malaise/fatigue, no chest pressure, no near-syncope, no palpitations, no syncope, no shortness of breath, no nausea and no vomiting. She has tried NSAIDs (steroids) for the symptoms. The treatment provided no relief.   Recently was seen at urgent care for this and was placed on prednisone without any relief.  Patient reports history of migraines however this is slightly different than her typical migraines. Today it is mostly generalized tension/pressure with intermittent sharp pains on the right. She denied any vision changes, focal numbness or weakness.   Past Medical History:  Diagnosis Date  . ADHD (attention deficit hyperactivity disorder)   . Lumbago   . Migraines   . Neuropathy [facility](HCC)     Patient Active Problem List   Diagnosis Date Noted  . Saddle anesthesia 12/17/2015  . Cervical cancer screening 09/05/2015  . Vaginal discharge 09/05/2015  . Abnormal appearance of cervix 09/05/2015  . Stressful life event affecting family 09/05/2015  . Adult ADHD (attention deficit hyperactivity disorder) 12/26/2014  . Depo-Provera contraceptive status 12/26/2014  . Cervical radiculopathy, chronic 12/26/2014  . Lumbago     Past Surgical History:  Procedure Laterality Date  . CERVICAL FUSION      OB History    No data available       Home  Medications    Prior to Admission medications   Medication Sig Start Date End Date Taking? Authorizing Provider  amphetamine-dextroamphetamine (ADDERALL) 12.5 MG tablet Take 1 tablet by mouth 3 (three) times daily. 02/14/16  Yes Gabriel Cirriheryl Wicker, NP  baclofen (LIORESAL) 10 MG tablet Take 10 mg by mouth at bedtime as needed.  11/05/15  Yes Historical Provider, MD  gabapentin (NEURONTIN) 100 MG capsule One by mouth every morning, and three by mouth at bedtime Patient taking differently: 100-400 mg. One by mouth every morning, and three by mouth at bedtime. Currently taking 400 mg at bedtime 09/05/15  Yes Kerman PasseyMelinda P Lada, MD  medroxyPROGESTERone (DEPO-PROVERA) 150 MG/ML injection Inject 150 mg into the muscle every 3 (three) months.   Yes Historical Provider, MD  mupirocin ointment (BACTROBAN) 2 % Apply 1 application topically at bedtime. 03/03/16 03/10/16 Yes Historical Provider, MD  predniSONE (STERAPRED UNI-PAK 21 TAB) 10 MG (21) TBPK tablet Take 10 mg by mouth taper from 4 doses each day to 1 dose and stop. Patient states will take 40 mg today and 30 mg tomorrow 03/03/16  Yes Historical Provider, MD  meloxicam (MOBIC) 7.5 MG tablet Take 7.5 mg by mouth 2 (two) times daily.    Historical Provider, MD    Family History Family History  Problem Relation Age of Onset  . Asthma Brother   . Anemia Brother   . Diabetes Maternal Grandfather   . Heart disease Maternal Grandfather   . Cancer Neg Hx   . COPD Neg Hx   .  Stroke Neg Hx     Social History Social History  Substance Use Topics  . Smoking status: Former Smoker    Packs/day: 1.00    Years: 10.00    Types: Cigarettes    Quit date: 07/07/2008  . Smokeless tobacco: Never Used  . Alcohol use No     Comment: occasional     Allergies   Review of patient's allergies indicates no known allergies.   Review of Systems Review of Systems  Constitutional: Negative for fever and malaise/fatigue.  HENT: Negative for congestion.   Eyes: Negative  for visual disturbance.  Respiratory: Negative for cough and shortness of breath.   Cardiovascular: Negative for chest pain, palpitations, leg swelling, syncope and near-syncope.  Gastrointestinal: Negative for abdominal distention, anorexia, nausea and vomiting.  Genitourinary: Negative for difficulty urinating.  Musculoskeletal: Negative for back pain and gait problem.  Neurological: Positive for headaches. Negative for dizziness.  All other systems reviewed and are negative.    Physical Exam Updated Vital Signs BP 129/87 (BP Location: Left Arm)   Pulse 92   Temp 98.3 F (36.8 C) (Oral)   Resp 18   Ht 5\' 3"  (1.6 m)   Wt 146 lb 14.4 oz (66.6 kg)   SpO2 100%   BMI 26.02 kg/m   Physical Exam  Constitutional: She is oriented to person, place, and time. She appears well-developed and well-nourished. No distress.  HENT:  Head: Normocephalic and atraumatic.  Right Ear: External ear normal.  Left Ear: External ear normal.  Nose: Nose normal.  Eyes: Conjunctivae and EOM are normal. Pupils are equal, round, and reactive to light. Right eye exhibits no discharge. Left eye exhibits no discharge. No scleral icterus.  Neck: Normal range of motion. Neck supple. Muscular tenderness (2 bilateral paraspinal muscles and upper fibers of the trapezius muscle, worse on the right. Palpation of these muscle groups exacerbated the patient's headache.) present. No spinous process tenderness present.  Cardiovascular: Normal rate, regular rhythm and normal heart sounds.  Exam reveals no gallop and no friction rub.   No murmur heard. Pulmonary/Chest: Effort normal and breath sounds normal. No stridor. No respiratory distress. She has no wheezes.  Abdominal: Soft. She exhibits no distension. There is no tenderness.  Musculoskeletal: She exhibits no edema or tenderness.  Neurological: She is alert and oriented to person, place, and time.  Mental Status: Alert and oriented to person, place, and time.  Attention and concentration normal. Speech clear. Recent memory is intac  Cranial Nerves  II Visual Fields: Intact to confrontation. Visual fields intact. III, IV, VI: Pupils equal and reactive to light and near. Full eye movement without nystagmus  V Facial Sensation: Normal. No weakness of masticatory muscles  VII: No facial weakness or asymmetry  VIII Auditory Acuity: Grossly normal  IX/X: The uvula is midline; the palate elevates symmetrically  XI: Normal sternocleidomastoid and trapezius strength  XII: The tongue is midline. No atrophy or fasciculations.   Motor System: Muscle Strength: 5/5 and symmetric in the upper and lower extremities. No pronation or drift.  Muscle Tone: Tone and muscle bulk are normal in the upper and lower extremities.   Reflexes: DTRs: 2+ and symmetrical in all four extremities. Plantar responses are flexor bilaterally.  Coordination: Intact finger-to-nose. No tremor.  Sensation: Intact to light touch, and pinprick. Negative Romberg test.  Gait: Routine gait normal   Skin: Skin is warm and dry. No rash noted. She is not diaphoretic. No erythema.  Psychiatric: She has a normal mood  and affect.     ED Treatments / Results  Labs (all labs ordered are listed, but only abnormal results are displayed) Labs Reviewed  COMPREHENSIVE METABOLIC PANEL - Abnormal; Notable for the following:       Result Value   CO2 19 (*)    Glucose, Bld 108 (*)    ALT 12 (*)    All other components within normal limits  CBC WITH DIFFERENTIAL/PLATELET  I-STAT BETA HCG BLOOD, ED ([facility], [facility], [facility] ONLY)    EKG  EKG Interpretation None       Radiology No results found.  Procedures Procedures (including critical care time)  Medications Ordered in ED Medications  bupivacaine (MARCAINE) 0.5 % injection 50 mL (50 mLs Infiltration Given by Other 03/05/16 1016)  prochlorperazine (COMPAZINE) injection 10 mg (10 mg Intravenous Given 03/05/16 1014)  diphenhydrAMINE (BENADRYL)  injection 25 mg (25 mg Intravenous Given 03/05/16 1015)     Initial Impression / Assessment and Plan / ED Course  I have reviewed the triage vital signs and the nursing notes.  Pertinent labs & imaging results that were available during my care of the patient were reviewed by me and considered in my medical decision making (see chart for details).  Clinical Course    Non focal neuro exam. No recent head trauma. No fever. Doubt meningitis. Doubt intracranial bleed. Doubt IIH. Given reproduction of headache with palpation of the paracervical muscle groups, is likely tension headache. Patient does report that she takes chronic NSAIDs for headaches, could be overuse headache. No indication for imaging. Will treat with headache cocktail and reevaluate.  Significant improvement after headache cocktail. Patient is safe for discharge with strict return precautions. Patient to follow-up with her PCP as needed. She was also provided with contact number for neurology for continued management of her recurrent headaches.    Final Clinical Impressions(s) / ED Diagnoses   Final diagnoses:  Headache disorder   Disposition: Discharge  Condition: Good  I have discussed the results, Dx and Tx plan with the patient  who expressed understanding and agree(s) with the plan. Discharge instructions discussed at great length. The patient was given strict return precautions who verbalized understanding of the instructions. No further questions at time of discharge.    Discharge Medication List as of 03/05/2016 11:49 AM      Follow Up: Gabriel Cirri, NP [address] [place] [address] [phone]  Call  As needed  [facility] NEUROLOGY [address] [place] [address] [phone] Schedule an appointment as soon as possible for a visit  For help establishing care with a care provider for further management of your headaches      Nira Conn, MD 03/06/16 [phone]

## 2016-03-05 NOTE — ED Notes (Signed)
Pt is in stable condition upon d/c and ambulates from ED. 

## 2016-03-06 ENCOUNTER — Ambulatory Visit (INDEPENDENT_AMBULATORY_CARE_PROVIDER_SITE_OTHER): Payer: Managed Care, Other (non HMO) | Admitting: Unknown Physician Specialty

## 2016-03-06 ENCOUNTER — Encounter: Payer: Self-pay | Admitting: Unknown Physician Specialty

## 2016-03-06 ENCOUNTER — Telehealth: Payer: Self-pay | Admitting: Unknown Physician Specialty

## 2016-03-06 VITALS — BP 116/76 | HR 73 | Temp 98.9°F | Ht 64.6 in | Wt 146.0 lb

## 2016-03-06 DIAGNOSIS — G43009 Migraine without aura, not intractable, without status migrainosus: Secondary | ICD-10-CM | POA: Diagnosis not present

## 2016-03-06 DIAGNOSIS — Q670 Congenital facial asymmetry: Secondary | ICD-10-CM

## 2016-03-06 DIAGNOSIS — M5412 Radiculopathy, cervical region: Secondary | ICD-10-CM

## 2016-03-06 MED ORDER — CYCLOBENZAPRINE HCL 10 MG PO TABS: 10.0000 mg | ORAL_TABLET | Freq: Three times a day (TID) | ORAL | 0 refills | Status: DC | PRN

## 2016-03-06 NOTE — Progress Notes (Signed)
BP 116/76 (BP Location: Left Arm, Patient Position: Sitting, Cuff Size: Normal)   Pulse 73   Temp 98.9 F (37.2 C)   Ht 5' 4.6" (1.641 m)   Wt 146 lb (66.2 kg)   SpO2 98%   BMI 24.60 kg/m    Subjective:    Patient ID: Amber Santana, female    DOB: [birth year], 35 y.o.   MRN: [id number]  HPI: Amber Santana is a 35 y.o. female  Chief Complaint  Patient presents with  . Migraine    pt states she went to urgent care Tuesday, ER yesterday and here today, States this migraine feels different than other migraines in the past    Headache Pt states she had a migraines in the past.  She states she has had a headache every day since 8 days ago.   She went to [place] walk in and started her on steroids.  She went to [facility] and given steroids and told to get a CT scan if no better.  States she went to the ER.  I reviewed the note and treated for a tension headache.    States she has stabbing pain right side of head, right facial heaviness and numbness on a dermatomal distribution "from nose to ear."  She feels better in the morning than later in the day.    History significant for cervical spine surgery 2011 or 2012 and told she "needs another."  Relevant past medical, surgical, family and social history reviewed and updated as indicated. Interim medical history since our last visit reviewed. Allergies and medications reviewed and updated.  Review of Systems  Per HPI unless specifically indicated above     Objective:    BP 116/76 (BP Location: Left Arm, Patient Position: Sitting, Cuff Size: Normal)   Pulse 73   Temp 98.9 F (37.2 C)   Ht 5' 4.6" (1.641 m)   Wt 146 lb (66.2 kg)   SpO2 98%   BMI 24.60 kg/m   Wt Readings from Last 3 Encounters:  03/06/16 146 lb (66.2 kg)  03/05/16 146 lb 14.4 oz (66.6 kg)  12/17/15 144 lb 6.4 oz (65.5 kg)    Physical Exam  Constitutional: She is oriented to person, place, and time. She appears well-developed and well-nourished. No distress.    HENT:  Head: Normocephalic and atraumatic.  Eyes: Conjunctivae and lids are normal. Right eye exhibits no discharge. Left eye exhibits no discharge. No scleral icterus.  Neck: Normal range of motion. Neck supple. No JVD present. Carotid bruit is not present.  Cardiovascular: Normal rate, regular rhythm and normal heart sounds.   Pulmonary/Chest: Effort normal and breath sounds normal.  Abdominal: Normal appearance. There is no splenomegaly or hepatomegaly.  Musculoskeletal: Normal range of motion.  paravetebral muscle tension right side  Neurological: She is alert and oriented to person, place, and time. She has normal strength. No cranial nerve deficit or sensory deficit.  Noted slight assymetry right side of face less reactive  Skin: Skin is warm, dry and intact. No rash noted. No pallor.  Psychiatric: She has a normal mood and affect. Her behavior is normal. Judgment and thought content normal.    Results for orders placed or performed during the hospital encounter of 03/05/16  CBC with Differential  Result Value Ref Range   WBC 9.9 4.0 - 10.5 K/uL   RBC 4.85 3.87 - 5.11 MIL/uL   Hemoglobin 14.1 12.0 - 15.0 g/dL   HCT 40.9 81.1 - 91.4 %  MCV 87.2 78.0 - 100.0 fL   MCH 29.1 26.0 - 34.0 pg   MCHC 33.3 30.0 - 36.0 g/dL   RDW 16.112.6 09.611.5 - 04.515.5 %   Platelets 297 150 - 400 K/uL   Neutrophils Relative % 76 %   Neutro Abs 7.6 1.7 - 7.7 K/uL   Lymphocytes Relative 18 %   Lymphs Abs 1.7 0.7 - 4.0 K/uL   Monocytes Relative 6 %   Monocytes Absolute 0.6 0.1 - 1.0 K/uL   Eosinophils Relative 0 %   Eosinophils Absolute 0.0 0.0 - 0.7 K/uL   Basophils Relative 0 %   Basophils Absolute 0.0 0.0 - 0.1 K/uL  Comprehensive metabolic panel  Result Value Ref Range   Sodium 138 135 - 145 mmol/L   Potassium 4.0 3.5 - 5.1 mmol/L   Chloride 108 101 - 111 mmol/L   CO2 19 (L) 22 - 32 mmol/L   Glucose, Bld 108 (H) 65 - 99 mg/dL   BUN 10 6 - 20 mg/dL   Creatinine, Ser 4.090.76 0.44 - 1.00 mg/dL    Calcium 9.1 8.9 - 81.110.3 mg/dL   Total Protein 7.3 6.5 - 8.1 g/dL   Albumin 4.6 3.5 - 5.0 g/dL   AST 17 15 - 41 U/L   ALT 12 (L) 14 - 54 U/L   Alkaline Phosphatase 45 38 - 126 U/L   Total Bilirubin 0.6 0.3 - 1.2 mg/dL   GFR calc non Af Amer >60 >60 mL/min   GFR calc Af Amer >60 >60 mL/min   Anion gap 11 5 - 15  I-Stat Beta hCG blood, ED ([facility], [facility], [facility] only)  Result Value Ref Range   I-stat hCG, quantitative <5.0 <5 mIU/mL   Comment 3              Assessment & Plan:   Problem List Items Addressed This Visit      Unprioritized   Cervical radiculopathy, chronic - Primary   Relevant Medications   cyclobenzaprine (FLEXERIL) 10 MG tablet   Other Relevant Orders   CT Head Wo Contrast    Other Visit Diagnoses    Migraine without aura and without status migrainosus, not intractable       Relevant Medications   cyclobenzaprine (FLEXERIL) 10 MG tablet   Other Relevant Orders   CT Head Wo Contrast   Facial asymmetries       Relevant Orders   CT Head Wo Contrast      Order CT of head due to facial asymetry. Will recheck on Tuesday   Follow up plan: Return for Tuesday.

## 2016-03-06 NOTE — Telephone Encounter (Signed)
Routing to provider  

## 2016-03-06 NOTE — Telephone Encounter (Signed)
Called and let patient know what Elnita MaxwellCheryl said. [facility]

## 2016-03-06 NOTE — Telephone Encounter (Signed)
Pt stated [facility] Tuesday for a migraine she has had since last Thursday and was given a steroid. She was told if she wasn't better in 2 days she needed to go to the ED for a CT scan which. She went to the ED but didn't get the CT and she still has the migraine. She would like to know what she should do but did not want to schedule an appt because she had been to seen twice this week but hasn't got any answers.

## 2016-03-06 NOTE — Telephone Encounter (Signed)
I can't order a CT scan without seeing her. We can refer her to neurology

## 2016-03-09 ENCOUNTER — Other Ambulatory Visit: Payer: Managed Care, Other (non HMO)

## 2016-03-09 ENCOUNTER — Ambulatory Visit
Admission: RE | Admit: 2016-03-09 | Discharge: 2016-03-09 | Disposition: A | Discharge status: Home / Self Care | Payer: Managed Care, Other (non HMO) | Source: Ambulatory Visit | Attending: Unknown Physician Specialty | Admitting: Unknown Physician Specialty

## 2016-03-09 DIAGNOSIS — Q670 Congenital facial asymmetry: Secondary | ICD-10-CM

## 2016-03-09 DIAGNOSIS — G43009 Migraine without aura, not intractable, without status migrainosus: Secondary | ICD-10-CM

## 2016-03-09 DIAGNOSIS — M5412 Radiculopathy, cervical region: Secondary | ICD-10-CM

## 2016-03-10 ENCOUNTER — Ambulatory Visit (INDEPENDENT_AMBULATORY_CARE_PROVIDER_SITE_OTHER): Payer: Managed Care, Other (non HMO) | Admitting: Unknown Physician Specialty

## 2016-03-10 ENCOUNTER — Encounter: Payer: Self-pay | Admitting: Unknown Physician Specialty

## 2016-03-10 ENCOUNTER — Encounter: Payer: Self-pay | Admitting: Family Medicine

## 2016-03-10 VITALS — BP 124/83 | HR 79 | Temp 98.1°F | Ht 64.6 in | Wt 150.2 lb

## 2016-03-10 DIAGNOSIS — Z3042 Encounter for surveillance of injectable contraceptive: Secondary | ICD-10-CM | POA: Diagnosis not present

## 2016-03-10 DIAGNOSIS — M5412 Radiculopathy, cervical region: Secondary | ICD-10-CM | POA: Diagnosis not present

## 2016-03-10 DIAGNOSIS — F909 Attention-deficit hyperactivity disorder, unspecified type: Secondary | ICD-10-CM

## 2016-03-10 DIAGNOSIS — F900 Attention-deficit hyperactivity disorder, predominantly inattentive type: Secondary | ICD-10-CM | POA: Diagnosis not present

## 2016-03-10 DIAGNOSIS — R519 Headache, unspecified: Secondary | ICD-10-CM

## 2016-03-10 DIAGNOSIS — R51 Headache: Secondary | ICD-10-CM | POA: Diagnosis not present

## 2016-03-10 MED ORDER — GABAPENTIN 400 MG PO CAPS: 400.0000 mg | ORAL_CAPSULE | Freq: Three times a day (TID) | ORAL | 0 refills | Status: DC

## 2016-03-10 NOTE — Assessment & Plan Note (Addendum)
Refer to neurology for further assessment and management.  Taking 400 mg of Gabapentin QHS.  Will titrate up to 400 mg TID

## 2016-03-10 NOTE — Progress Notes (Signed)
BP 124/83 (BP Location: Left Arm, Patient Position: Sitting, Cuff Size: Normal)   Pulse 79   Temp 98.1 F (36.7 C)   Ht 5' 4.6" (1.641 m)   Wt 150 lb 3.2 oz (68.1 kg)   SpO2 100%   BMI 25.31 kg/m    Subjective:    Patient ID: Amber Santana, female    DOB: 07/06/1981, 35 y.o.   MRN: [id number]  HPI: Amber Santana is a 35 y.o. female  Chief Complaint  Patient presents with  . ADHD   ADHD Feels it is working well.  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  She works for [employer].    Headaches Pt with continued ongoing headaches.  Her CT was reviewed and was negative.  She has had neck surgery.  She has had accupuncture done in the past which has helped in the past.    ADHD Everything is stable.    Relevant past medical, surgical, family and social history reviewed and updated as indicated. Interim medical history since our last visit reviewed. Allergies and medications reviewed and updated.  Review of Systems  Per HPI unless specifically indicated above     Objective:    BP 124/83 (BP Location: Left Arm, Patient Position: Sitting, Cuff Size: Normal)   Pulse 79   Temp 98.1 F (36.7 C)   Ht 5' 4.6" (1.641 m)   Wt 150 lb 3.2 oz (68.1 kg)   SpO2 100%   BMI 25.31 kg/m   Wt Readings from Last 3 Encounters:  03/10/16 150 lb 3.2 oz (68.1 kg)  03/06/16 146 lb (66.2 kg)  03/05/16 146 lb 14.4 oz (66.6 kg)    Physical Exam  Constitutional: She is oriented to person, place, and time. She appears well-developed and well-nourished. No distress.  HENT:  Head: Normocephalic and atraumatic.  Eyes: Conjunctivae and lids are normal. Right eye exhibits no discharge. Left eye exhibits no discharge. No scleral icterus.  Cardiovascular: Normal rate.   Pulmonary/Chest: Effort normal.  Abdominal: Normal appearance. There is no splenomegaly or hepatomegaly.  Musculoskeletal: Normal range of motion.  Neurological: She is alert and  oriented to person, place, and time.  Skin: Skin is intact. No rash noted. No pallor.  Psychiatric: She has a normal mood and affect. Her behavior is normal. Judgment and thought content normal.    Results for orders placed or performed during the hospital encounter of 03/05/16  CBC with Differential  Result Value Ref Range   WBC 9.9 4.0 - 10.5 K/uL   RBC 4.85 3.87 - 5.11 MIL/uL   Hemoglobin 14.1 12.0 - 15.0 g/dL   HCT 40.9 81.1 - 91.4 %   MCV 87.2 78.0 - 100.0 fL   MCH 29.1 26.0 - 34.0 pg   MCHC 33.3 30.0 - 36.0 g/dL   RDW 78.2 95.6 - 21.3 %   Platelets 297 150 - 400 K/uL   Neutrophils Relative % 76 %   Neutro Abs 7.6 1.7 - 7.7 K/uL   Lymphocytes Relative 18 %   Lymphs Abs 1.7 0.7 - 4.0 K/uL   Monocytes Relative 6 %   Monocytes Absolute 0.6 0.1 - 1.0 K/uL   Eosinophils Relative 0 %   Eosinophils Absolute 0.0 0.0 - 0.7 K/uL   Basophils Relative 0 %   Basophils Absolute 0.0 0.0 - 0.1 K/uL  Comprehensive metabolic panel  Result Value Ref Range   Sodium 138 135 - 145 mmol/L  Potassium 4.0 3.5 - 5.1 mmol/L   Chloride 108 101 - 111 mmol/L   CO2 19 (L) 22 - 32 mmol/L   Glucose, Bld 108 (H) 65 - 99 mg/dL   BUN 10 6 - 20 mg/dL   Creatinine, Ser 0.980.76 0.44 - 1.00 mg/dL   Calcium 9.1 8.9 - 11.910.3 mg/dL   Total Protein 7.3 6.5 - 8.1 g/dL   Albumin 4.6 3.5 - 5.0 g/dL   AST 17 15 - 41 U/L   ALT 12 (L) 14 - 54 U/L   Alkaline Phosphatase 45 38 - 126 U/L   Total Bilirubin 0.6 0.3 - 1.2 mg/dL   GFR calc non Af Amer >60 >60 mL/min   GFR calc Af Amer >60 >60 mL/min   Anion gap 11 5 - 15  I-Stat Beta hCG blood, ED ([facility], [facility], [facility] only)  Result Value Ref Range   I-stat hCG, quantitative <5.0 <5 mIU/mL   Comment 3              Assessment & Plan:   Problem List Items Addressed This Visit      Unprioritized   Cervical radiculopathy, chronic - Primary    Refer to neurology for further assessment and management.  Taking 400 mg of Gabapentin QHS.  Will titrate up to 400 mg TID       Relevant Medications   gabapentin (NEURONTIN) 400 MG capsule   Worsening headaches   Relevant Medications   gabapentin (NEURONTIN) 400 MG capsule   Other Relevant Orders   Ambulatory referral to Neurology    Other Visit Diagnoses   None.      Follow up plan: No Follow-up on file.

## 2016-03-10 NOTE — Assessment & Plan Note (Signed)
Stable

## 2016-03-11 ENCOUNTER — Encounter: Payer: Self-pay | Admitting: Unknown Physician Specialty

## 2016-03-11 DIAGNOSIS — L709 Acne, unspecified: Secondary | ICD-10-CM | POA: Insufficient documentation

## 2016-03-11 DIAGNOSIS — L700 Acne vulgaris: Secondary | ICD-10-CM

## 2016-03-11 MED ORDER — BENZOYL PEROXIDE 10 % EX CREA: 1.0000 "application " | TOPICAL_CREAM | Freq: Every day | CUTANEOUS | 1 refills | Status: DC

## 2016-03-12 ENCOUNTER — Encounter: Payer: Self-pay | Admitting: Unknown Physician Specialty

## 2016-03-13 ENCOUNTER — Ambulatory Visit: Payer: Managed Care, Other (non HMO)

## 2016-03-26 ENCOUNTER — Other Ambulatory Visit: Payer: Self-pay | Admitting: Unknown Physician Specialty

## 2016-03-26 MED ORDER — AMPHETAMINE-DEXTROAMPHETAMINE 12.5 MG PO TABS: 12.5000 mg | ORAL_TABLET | Freq: Three times a day (TID) | ORAL | 0 refills | Status: DC

## 2016-04-03 ENCOUNTER — Encounter: Payer: Self-pay | Admitting: Unknown Physician Specialty

## 2016-04-03 ENCOUNTER — Other Ambulatory Visit: Payer: Self-pay | Admitting: Unknown Physician Specialty

## 2016-04-03 MED ORDER — CYCLOBENZAPRINE HCL 10 MG PO TABS: 10.0000 mg | ORAL_TABLET | Freq: Three times a day (TID) | ORAL | 0 refills | Status: DC | PRN

## 2016-05-04 ENCOUNTER — Other Ambulatory Visit: Payer: Self-pay | Admitting: Unknown Physician Specialty

## 2016-05-04 MED ORDER — AMPHETAMINE-DEXTROAMPHETAMINE 12.5 MG PO TABS: 12.5000 mg | ORAL_TABLET | Freq: Three times a day (TID) | ORAL | 0 refills | Status: DC

## 2016-05-27 ENCOUNTER — Other Ambulatory Visit: Payer: Self-pay | Admitting: Unknown Physician Specialty

## 2016-05-27 MED ORDER — AMPHETAMINE-DEXTROAMPHETAMINE 12.5 MG PO TABS
12.5000 mg | ORAL_TABLET | Freq: Three times a day (TID) | ORAL | 0 refills | Status: DC
Start: 2016-05-27 — End: 2016-09-10

## 2016-06-03 ENCOUNTER — Ambulatory Visit: Payer: Self-pay | Admitting: Unknown Physician Specialty

## 2016-07-26 ENCOUNTER — Other Ambulatory Visit: Payer: Self-pay | Admitting: Unknown Physician Specialty

## 2016-09-10 ENCOUNTER — Encounter: Payer: Self-pay | Admitting: Family Medicine

## 2016-09-10 ENCOUNTER — Ambulatory Visit (INDEPENDENT_AMBULATORY_CARE_PROVIDER_SITE_OTHER): Payer: Self-pay | Admitting: Family Medicine

## 2016-09-10 VITALS — BP 128/85 | HR 87 | Temp 98.6°F | Wt 157.0 lb

## 2016-09-10 DIAGNOSIS — Z3040 Encounter for surveillance of contraceptives, unspecified: Secondary | ICD-10-CM

## 2016-09-10 DIAGNOSIS — M7662 Achilles tendinitis, left leg: Secondary | ICD-10-CM

## 2016-09-10 DIAGNOSIS — F909 Attention-deficit hyperactivity disorder, unspecified type: Secondary | ICD-10-CM

## 2016-09-10 DIAGNOSIS — Z3042 Encounter for surveillance of injectable contraceptive: Secondary | ICD-10-CM

## 2016-09-10 DIAGNOSIS — M7661 Achilles tendinitis, right leg: Secondary | ICD-10-CM

## 2016-09-10 MED ORDER — GABAPENTIN 300 MG PO CAPS: 300.0000 mg | ORAL_CAPSULE | Freq: Three times a day (TID) | ORAL | 1 refills | Status: DC

## 2016-09-10 MED ORDER — AMPHETAMINE-DEXTROAMPHET ER 20 MG PO CP24: 20.0000 mg | ORAL_CAPSULE | Freq: Every day | ORAL | 0 refills | Status: DC

## 2016-09-10 MED ORDER — CYCLOBENZAPRINE HCL 10 MG PO TABS: 10.0000 mg | ORAL_TABLET | Freq: Three times a day (TID) | ORAL | 0 refills | Status: DC | PRN

## 2016-09-10 MED ORDER — DICLOFENAC SODIUM 3 % TD GEL: 1.0000 "application " | Freq: Three times a day (TID) | TRANSDERMAL | 3 refills | Status: DC | PRN

## 2016-09-10 NOTE — Assessment & Plan Note (Addendum)
Will try extended release Adderall 20 mg instead of multiple IR tablets daily.

## 2016-09-10 NOTE — Assessment & Plan Note (Signed)
Administered Depo-Shot in clinic.

## 2016-09-10 NOTE — Progress Notes (Signed)
BP 128/85   Pulse 87   Temp 98.6 F (37 C)   Wt 157 lb (71.2 kg)   LMP  (LMP Unknown)   SpO2 100%   BMI 26.45 kg/m    Subjective:    Patient ID: Amber MeiersJulie M Santana, female    DOB: 04/13/1981, 36 y.o.   MRN: [id number]  HPI: Amber Santana is a 36 y.o. female  Chief Complaint  Patient presents with  . ADHD    She wants to get back started on the Adderall. She had an old bottle of 10mg  and she's been taking 1-2 of them a day.  . Contraception    She wants to start back on the depo shot.  . Feet Pain    Bilateral feet pain x approx 4 months. Is on her feet all day long.   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 States she has less than 10 pills left and would it refilled.   Contraception She expresses interest in restarting the Depo Shot. Last received it here in September 2017 and was unable to get it 3 months after that  because her husband's insurance had changed. Denies having had any negative side effects from it in the past.  Feet pain Patient states she wakes up in the morning with bilateral feet pain especially in her heels x 4 months. Worse with contact or pressure. She has tried new shoes, compression stockings, and NSAIDs without relief. She has history of Raynaud's and had had numbness in her toes and now has numbness in her heels. Denies trauma.    Relevant past medical, surgical, family and social history reviewed and updated as indicated. Interim medical history since our last visit reviewed. Allergies and medications reviewed and updated.  Review of Systems  Per HPI unless specifically indicated above     Objective:    BP 128/85   Pulse 87   Temp 98.6 F (37 C)   Wt 157 lb (71.2 kg)   LMP  (LMP Unknown)   SpO2 100%   BMI 26.45 kg/m   Wt Readings from Last 3  Encounters:  09/10/16 157 lb (71.2 kg)  03/10/16 150 lb 3.2 oz (68.1 kg)  03/06/16 146 lb (66.2 kg)    Physical Exam  Constitutional: She is oriented to person, place, and time. She appears well-developed and well-nourished. No distress.  HENT:  Head: Normocephalic and atraumatic.  Eyes: Conjunctivae are normal. Right eye exhibits no discharge. Left eye exhibits no discharge. No scleral icterus.  Neck: Normal range of motion. Neck supple. No JVD present.  Cardiovascular: Normal rate, regular rhythm, normal heart sounds and intact distal pulses.   Pulmonary/Chest: Effort normal and breath sounds normal. No respiratory distress. She has no wheezes. She has no rales.  Abdominal: Soft. Bowel sounds are normal.  Musculoskeletal: Normal range of motion.  Mild tenderness to palpation over achilles tendon and heels bilaterally. No pain with dorsiflexion.   Neurological: She is alert and oriented to person, place, and time.  Skin: Skin is warm and dry.  Psychiatric: She has a normal mood and affect. Her behavior is normal. Judgment and thought content normal.      Assessment & Plan:   Problem List Items Addressed This Visit  Other   Adult ADHD (attention deficit hyperactivity disorder) (Chronic)    Will try extended release Adderall 20 mg instead of multiple IR tablets daily.       Depo-Provera contraceptive status (Chronic)    Administered Depo-Shot in clinic.       Other Visit Diagnoses    Encounter for surveillance of injectable contraceptive    -  Primary   Relevant Orders   Pregnancy, urine (Completed)   Achilles tendinitis of both lower extremities       Epsom salt soaks, diclofenac gel, orthotic shoes. Daily stretches, compression braces. Follow up if no improvement.        Follow up plan: Return in about 6 months (around 03/13/2017) for physical.

## 2016-09-11 LAB — PREGNANCY, URINE: PREG TEST UR: NEGATIVE

## 2016-09-16 IMAGING — CT CT HEAD W/O CM
3 of 4 series · 16 of 47 positions shown, 19 images · non-contrast
Comparison: December 21, 2005

CLINICAL DATA: Headache for 11 days with blurred vision and
dizziness.

EXAM:
CT HEAD WITHOUT CONTRAST
TECHNIQUE: Contiguous axial images were obtained from the base of the skull
through the vertex without intravenous contrast.

[Series 32: 3d filtered head w/o · axial · non-contrast · 0.49mm/px · z∈[-25,+105]mm · 10 of 32 slices shown, 13 images]
[im 3/32  brain]
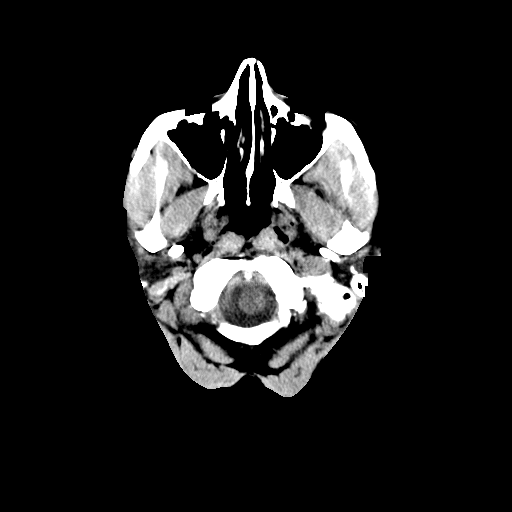
[im 3/32  bone]
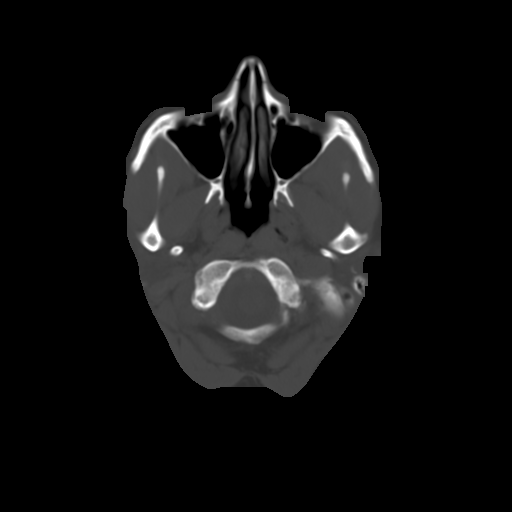
[im 5/32  brain]
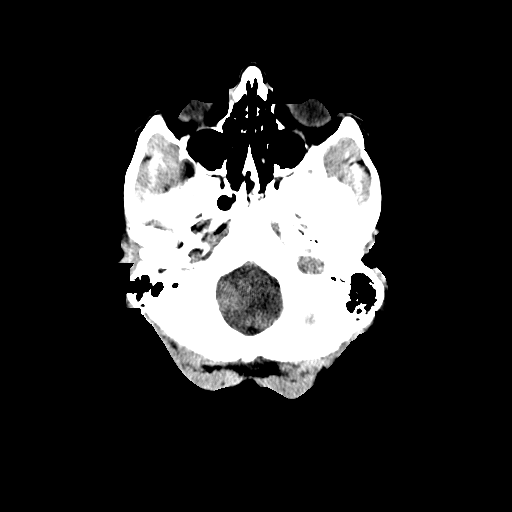
[im 9/32  brain]
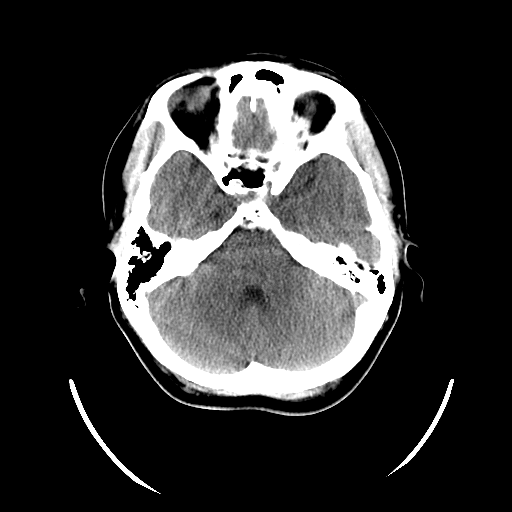
[im 12/32  brain]
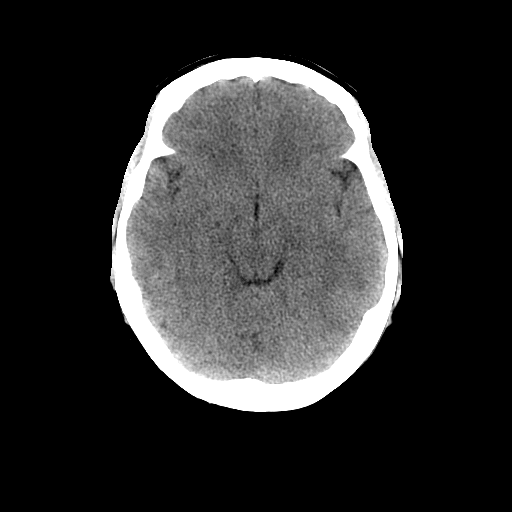
[im 14/32  brain]
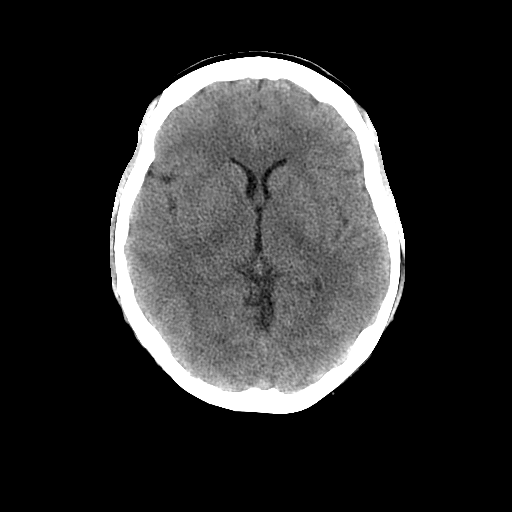
[im 14/32  bone]
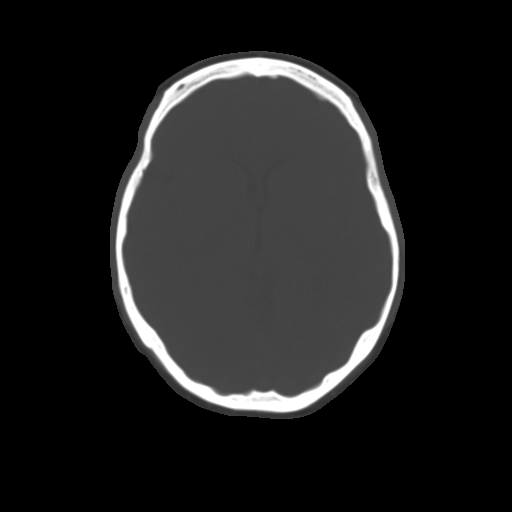
[im 18/32  brain]
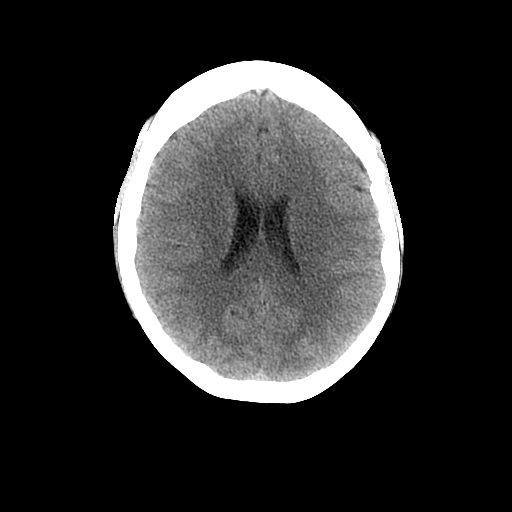
[im 20/32  brain]
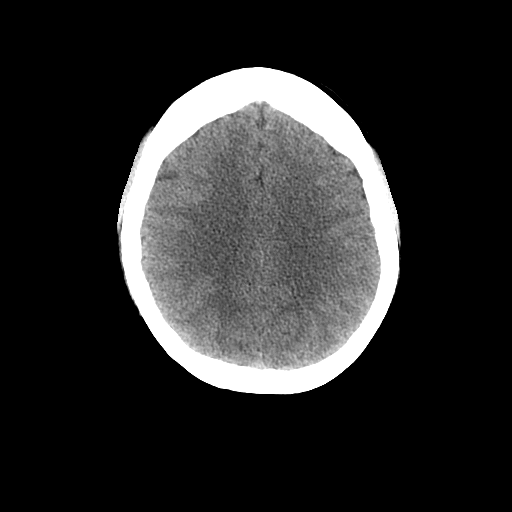
[im 23/32  brain]
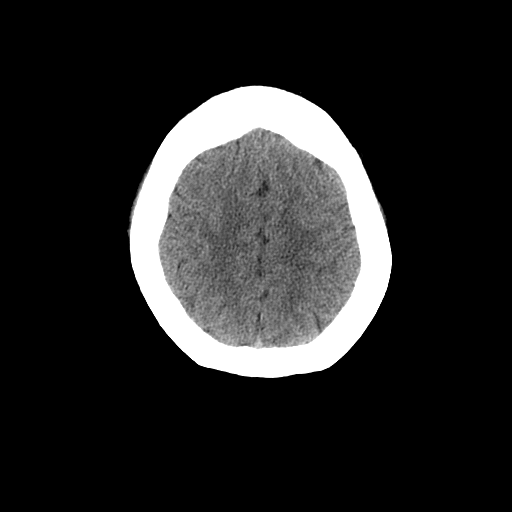
[im 27/32  brain]
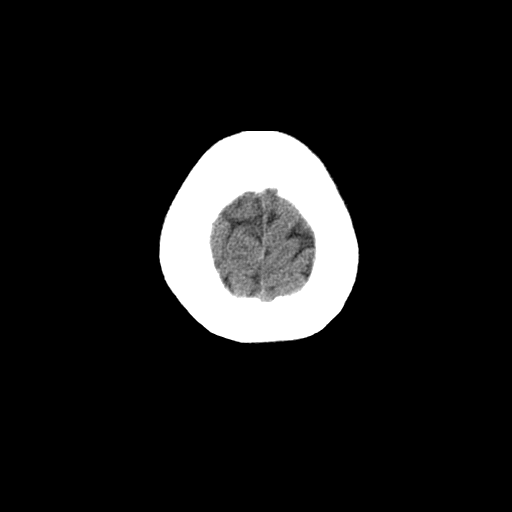
[im 27/32  bone]
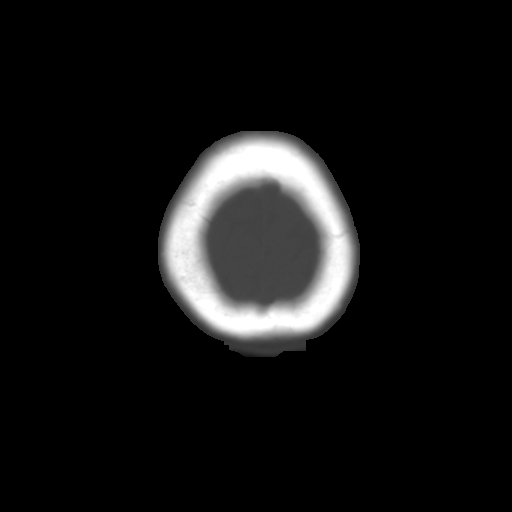
[im 29/32  brain]
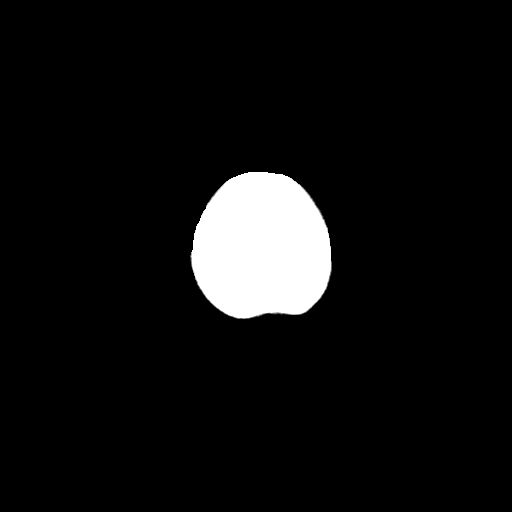

[Series 601: coronal brain · coronal · 0.49mm/px · 3 of 63 slices shown]
[im 21/63  brain]
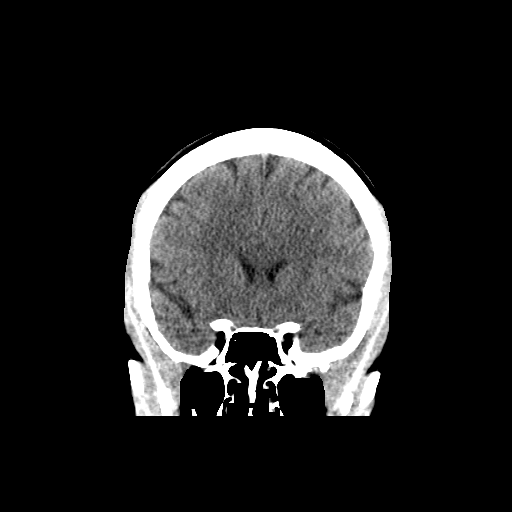
[im 28/63  brain]
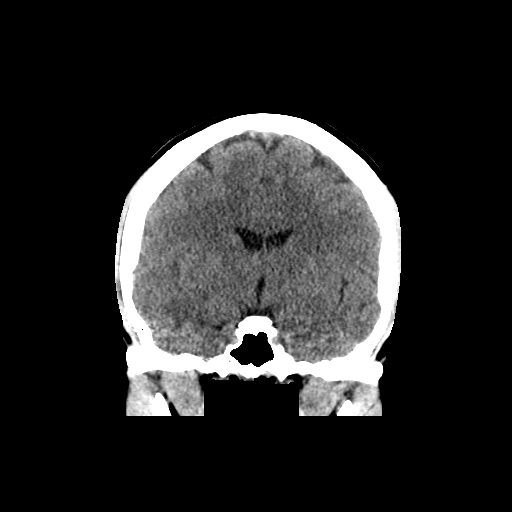
[im 35/63  brain]
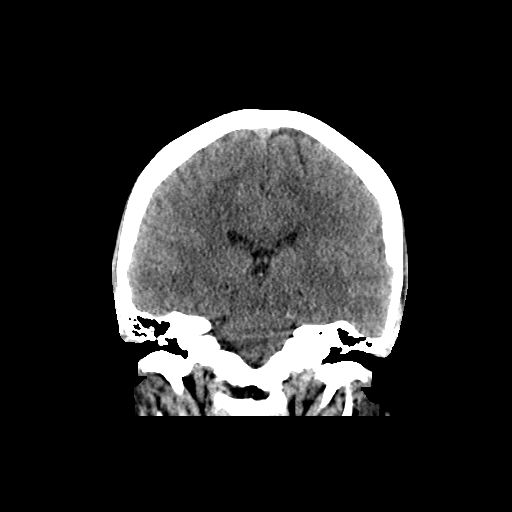

[Series 602: sagittal brain · sagittal · 0.49mm/px · 3 of 59 slices shown]
[im 20/59  brain]
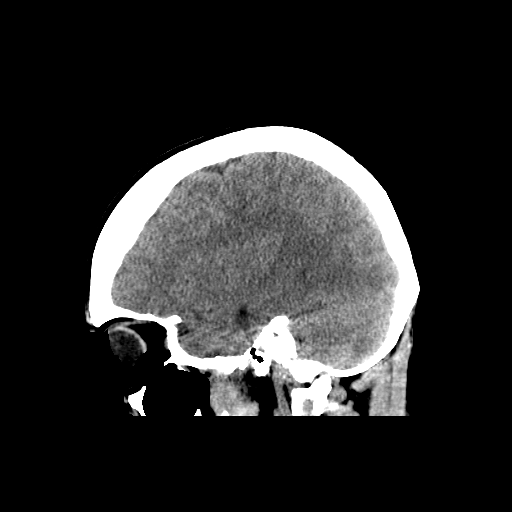
[im 30/59  brain]
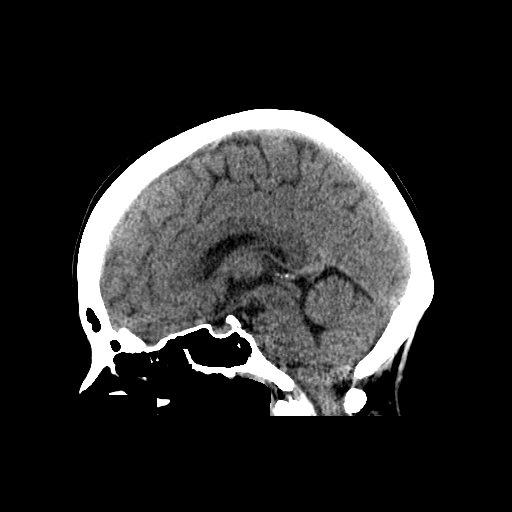
[im 39/59  brain]
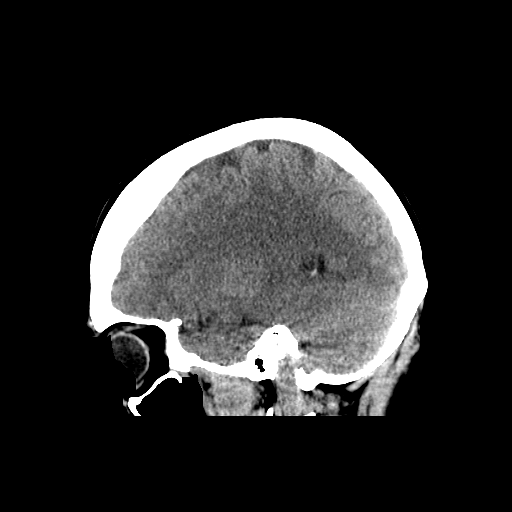

[16 of 47 positions shown; findings below may reference images not displayed]

FINDINGS: Brain: The ventricles are normal in size and configuration. There is
no intracranial mass, hemorrhage, extra-axial fluid collection, or
midline shift. Gray-white compartments are normal. No acute infarct
evident.

Vascular: There is no hyperdense vessel. No vascular calcification
evident.

Skull: The bony calvarium appears intact.

Sinuses/Orbits: Orbits appear symmetric bilaterally. Visualized
paranasal sinuses are clear.

Other: Mastoid air cells are clear.
IMPRESSION: Study within normal limits.

## 2016-10-27 ENCOUNTER — Other Ambulatory Visit: Payer: Self-pay

## 2016-10-27 MED ORDER — GABAPENTIN 300 MG PO CAPS: 300.0000 mg | ORAL_CAPSULE | Freq: Three times a day (TID) | ORAL | 1 refills | Status: DC

## 2016-10-27 NOTE — Telephone Encounter (Signed)
Would like a new rx with a 90 day supply.

## 2016-11-02 ENCOUNTER — Other Ambulatory Visit: Payer: Self-pay | Admitting: Family Medicine

## 2016-11-02 MED ORDER — AMPHETAMINE-DEXTROAMPHET ER 20 MG PO CP24: 20.0000 mg | ORAL_CAPSULE | Freq: Every day | ORAL | 0 refills | Status: DC

## 2016-11-12 ENCOUNTER — Other Ambulatory Visit: Payer: Self-pay

## 2016-11-12 MED ORDER — GABAPENTIN 300 MG PO CAPS: 300.0000 mg | ORAL_CAPSULE | Freq: Three times a day (TID) | ORAL | 0 refills | Status: DC

## 2016-11-12 NOTE — Telephone Encounter (Signed)
90 day supply request for Gabapentin 300 mg #270.

## 2016-11-25 ENCOUNTER — Encounter: Payer: Self-pay | Admitting: Family Medicine

## 2016-11-26 ENCOUNTER — Encounter: Payer: Self-pay | Admitting: Family Medicine

## 2017-01-08 ENCOUNTER — Ambulatory Visit: Payer: Self-pay | Admitting: Physician Assistant

## 2017-01-08 ENCOUNTER — Encounter: Payer: Self-pay | Admitting: Physician Assistant

## 2017-01-08 VITALS — BP 140/82 | HR 100 | Temp 98.5°F

## 2017-01-08 DIAGNOSIS — R109 Unspecified abdominal pain: Secondary | ICD-10-CM

## 2017-01-08 MED ORDER — PANTOPRAZOLE SODIUM 40 MG PO TBEC: 40.0000 mg | DELAYED_RELEASE_TABLET | Freq: Every day | ORAL | 3 refills | Status: DC

## 2017-01-08 NOTE — Progress Notes (Signed)
Contacted [name] in radiology registration appt scheduled @ [facility] on 01/11/2017 @ 9:30 NPO. Per patient request wanting to wait til Monday due to insurance issue

## 2017-01-08 NOTE — Progress Notes (Signed)
S: c/o epigastric pain, states its a gnawing type pain, worse after eating, is under a lot of stress, hasn't had her depo in over a month, some nausea, no v/d, no fever/chills, been doing keto diet and has lost about 18 lbs  O: vitals wnl, nad, lungs c t a, cv rrr, abd soft nontender in ruq, epigastric area is tender, bs normal all 4 quads, urine preg neg  A: acute abdominal pain  P: us of abd, protonix, pt deferred stat us, states her insurance doesn't start until Sunday, can we wait and do it on Monday, agreed with pt, told her if worsening she need to go to the ER

## 2017-01-11 ENCOUNTER — Ambulatory Visit: Payer: Managed Care, Other (non HMO)

## 2017-01-11 ENCOUNTER — Other Ambulatory Visit: Payer: Self-pay | Admitting: Family Medicine

## 2017-01-11 ENCOUNTER — Encounter: Payer: Self-pay | Admitting: Physician Assistant

## 2017-01-11 ENCOUNTER — Encounter: Payer: Self-pay | Admitting: Family Medicine

## 2017-01-11 MED ORDER — AMPHETAMINE-DEXTROAMPHET ER 20 MG PO CP24: 20.0000 mg | ORAL_CAPSULE | Freq: Every day | ORAL | 0 refills | Status: DC

## 2017-01-11 MED ORDER — CYCLOBENZAPRINE HCL 10 MG PO TABS
10.0000 mg | ORAL_TABLET | Freq: Three times a day (TID) | ORAL | 0 refills | Status: DC | PRN
Start: 2017-01-11 — End: 2017-05-05

## 2017-01-11 NOTE — Telephone Encounter (Signed)
Patient d/c ultrasound. See addendum . She will attempt to schedule GI. If unable asked if we will

## 2017-01-11 NOTE — Progress Notes (Signed)
Patient used mychart informed [name] that she felt a little better no longer epigastric but abd pain. Noodles and protonix medication did help. So patient canceled ultrasound. Per patient while seen in office provider  stated that if utrasound was negative would refer her to GI.    Patient stated that she would attempt to schedule GI herself if unable will get  In touch with us.

## 2017-01-12 LAB — POCT URINE PREGNANCY: Preg Test, Ur: NEGATIVE

## 2017-01-12 NOTE — Addendum Note (Signed)
Addended by: Yvonne KendallBROWN, Tome Wilson D on: 01/12/2017 03:44 PM   Modules accepted: Orders

## 2017-01-14 ENCOUNTER — Other Ambulatory Visit: Payer: Self-pay | Admitting: Family Medicine

## 2017-01-14 ENCOUNTER — Encounter: Payer: Self-pay | Admitting: Family Medicine

## 2017-01-15 ENCOUNTER — Other Ambulatory Visit: Payer: Self-pay | Admitting: Family Medicine

## 2017-01-15 MED ORDER — AMPHETAMINE-DEXTROAMPHETAMINE 20 MG PO TABS: 20.0000 mg | ORAL_TABLET | Freq: Two times a day (BID) | ORAL | 0 refills | Status: DC

## 2017-01-15 MED ORDER — GABAPENTIN 300 MG PO CAPS: 300.0000 mg | ORAL_CAPSULE | Freq: Three times a day (TID) | ORAL | 0 refills | Status: DC

## 2017-01-15 MED ORDER — AMPHETAMINE-DEXTROAMPHETAMINE 10 MG PO TABS: 10.0000 mg | ORAL_TABLET | Freq: Two times a day (BID) | ORAL | 0 refills | Status: DC

## 2017-01-15 NOTE — Telephone Encounter (Signed)
Please call pt and let her know that we will need her to bring her XR script in to be destroyed and at that time I will get her a new one for regular release. Thanks!

## 2017-01-15 NOTE — Telephone Encounter (Signed)
Pt left prescription at the pharmacy. After speaking with Fleet ContrasRachel she stated that it would be ok to call and confirm this information with the pharmacy.

## 2017-01-20 ENCOUNTER — Other Ambulatory Visit: Payer: Self-pay

## 2017-01-20 ENCOUNTER — Ambulatory Visit (INDEPENDENT_AMBULATORY_CARE_PROVIDER_SITE_OTHER): Payer: 59 | Admitting: Gastroenterology

## 2017-01-20 ENCOUNTER — Encounter: Payer: Self-pay | Admitting: Gastroenterology

## 2017-01-20 VITALS — BP 126/76 | HR 88 | Ht 64.0 in | Wt 147.5 lb

## 2017-01-20 DIAGNOSIS — R1310 Dysphagia, unspecified: Secondary | ICD-10-CM

## 2017-01-20 DIAGNOSIS — G8929 Other chronic pain: Secondary | ICD-10-CM | POA: Diagnosis not present

## 2017-01-20 DIAGNOSIS — R1013 Epigastric pain: Secondary | ICD-10-CM | POA: Diagnosis not present

## 2017-01-20 NOTE — Progress Notes (Signed)
Gastroenterology Consultation  Referring Provider:     Particia Nearing,* Primary Care Physician:  Particia Nearing, [place] Primary Gastroenterologist:  Dr. Servando Snare     Reason for Consultation:     Abdominal pain        HPI:   Amber Santana is a 36 y.o. y/o female referred for consultation & management of Abdominal pain by Dr. Maurice March, Salley Hews, PA-C.  This patient comes in today with a report of abdominal pain. The patient states that she was having severe abdominal pain that was related to her eating. The patient has been on a diet and lost 20 pounds in states that when the abdominal pain started she was given Protonix. The patient felt better with the Protonix but then states that she started to have pain when she missed one dose. The patient feels like her entire abdomen is inflamed. There is no report of any black stools or bloody stools. The patient also denies any nausea or vomiting. The patient has had some problems with swallowing with sticking of chicken steak and bread when she eats. She states that she usually has to chase it down with a glass of water. There is no report of any family history of any GI problems. The patient is currently taking her Protonix.  Past Medical History:  Diagnosis Date  . ADHD (attention deficit hyperactivity disorder)   . Lumbago   . Migraines   . Neuropathy     Past Surgical History:  Procedure Laterality Date  . CERVICAL FUSION      Prior to Admission medications   Medication Sig Start Date End Date Taking? Authorizing Provider  amphetamine-dextroamphetamine (ADDERALL) 20 MG tablet Take 1 tablet (20 mg total) by mouth 2 (two) times daily. 01/15/17  Yes Particia Nearing, PA-C  cyclobenzaprine (FLEXERIL) 10 MG tablet Take 1 tablet (10 mg total) by mouth 3 (three) times daily as needed for muscle spasms. 01/11/17  Yes Steele Sizer, MD  gabapentin (NEURONTIN) 100 MG capsule One by mouth every morning, and three by mouth at  bedtime 09/05/15  Yes Lada, Janit Bern, MD  gabapentin (NEURONTIN) 300 MG capsule Take 1 capsule (300 mg total) by mouth 3 (three) times daily. 01/15/17  Yes Particia Nearing, PA-C  medroxyPROGESTERone (DEPO-PROVERA) 150 MG/ML injection Inject 150 mg into the muscle every 3 (three) months.   Yes [provider]  pantoprazole (PROTONIX) 40 MG tablet Take 1 tablet (40 mg total) by mouth daily. 01/08/17  Yes Fisher, Roselyn Bering, PA-C  Diclofenac Sodium 3 % GEL Place 1 application onto the skin 3 (three) times daily as needed. Patient not taking: Reported on 01/08/2017 09/10/16   Particia Nearing, PA-C    Family History  Problem Relation Age of Onset  . Asthma Brother   . Anemia Brother   . Diabetes Maternal Grandfather   . Heart disease Maternal Grandfather   . Cancer Neg Hx   . COPD Neg Hx   . Stroke Neg Hx      Social History  Substance Use Topics  . Smoking status: Former Smoker    Packs/day: 1.00    Years: 10.00    Types: Cigarettes    Quit date: 07/07/2008  . Smokeless tobacco: Never Used  . Alcohol use No     Comment: occasional    Allergies as of 01/20/2017  . (No Known Allergies)    Review of Systems:    All systems reviewed and negative except where  noted in HPI.   Physical Exam:  BP 126/76   Pulse 88   Ht 5\' 4"  (1.626 m)   Wt 147 lb 8 oz (66.9 kg)   BMI 25.32 kg/m  No LMP recorded. Patient has had an injection. Psych:  Alert and cooperative. Normal mood and affect. General:   Alert,  Well-developed, well-nourished, pleasant and cooperative in NAD Head:  Normocephalic and atraumatic. Eyes:  Sclera clear, no icterus.   Conjunctiva pink. Ears:  Normal auditory acuity. Nose:  No deformity, discharge, or lesions. Mouth:  No deformity or lesions,oropharynx pink & moist. Neck:  Supple; no masses or thyromegaly. Lungs:  Respirations even and unlabored.  Clear throughout to auscultation.   No wheezes, crackles, or rhonchi. No acute distress. Heart:   Regular rate and rhythm; no murmurs, clicks, rubs, or gallops. Abdomen:  Normal bowel sounds.  No bruits.  Soft, Mild epigastric tenderness to palpation and non-distended without masses, hepatosplenomegaly or hernias noted.  No guarding or rebound tenderness.  Negative Carnett sign.   Rectal:  Deferred.  Msk:  Symmetrical without gross deformities.  Good, equal movement & strength bilaterally. Pulses:  Normal pulses noted. Extremities:  No clubbing or edema.  No cyanosis. Neurologic:  Alert and oriented x3;  grossly normal neurologically. Skin:  Intact without significant lesions or rashes.  No jaundice. Lymph Nodes:  No significant cervical adenopathy. Psych:  Alert and cooperative. Normal mood and affect.  Imaging Studies: No results found.  Assessment and Plan:   Amber Santana is a 36 y.o. y/o female who comes in with continued abdominal discomfort despite Protonix. The patient will start on Dexilant to see if this helps her symptoms. The patient will also be set up for an upper endoscopy due to her dysphagia and continued pain on a PPI. I have discussed risks & benefits which include, but are not limited to, bleeding, infection, perforation & drug reaction.  The patient agrees with this plan & written consent will be obtained.     Midge Miniumarren Sareena Odeh, MD. Clementeen GrahamFACG   Note: This dictation was prepared with Dragon dictation along with smaller phrase technology. Any transcriptional errors that result from this process are unintentional.

## 2017-01-21 ENCOUNTER — Encounter: Payer: Self-pay | Admitting: *Deleted

## 2017-01-25 ENCOUNTER — Ambulatory Visit
Admission: RE | Admit: 2017-01-25 | Discharge: 2017-01-25 | Disposition: A | Discharge status: Home / Self Care | Payer: 59 | Source: Ambulatory Visit | Attending: Gastroenterology | Admitting: Gastroenterology

## 2017-01-25 ENCOUNTER — Ambulatory Visit: Payer: 59 | Admitting: Anesthesiology

## 2017-01-25 ENCOUNTER — Encounter
Admission: RE | Disposition: A | Discharge status: Home / Self Care | Payer: Self-pay | Source: Ambulatory Visit | Attending: Gastroenterology

## 2017-01-25 DIAGNOSIS — K219 Gastro-esophageal reflux disease without esophagitis: Secondary | ICD-10-CM | POA: Diagnosis not present

## 2017-01-25 DIAGNOSIS — Z79899 Other long term (current) drug therapy: Secondary | ICD-10-CM | POA: Insufficient documentation

## 2017-01-25 DIAGNOSIS — R1319 Other dysphagia: Secondary | ICD-10-CM | POA: Diagnosis not present

## 2017-01-25 DIAGNOSIS — G629 Polyneuropathy, unspecified: Secondary | ICD-10-CM | POA: Diagnosis not present

## 2017-01-25 DIAGNOSIS — R1310 Dysphagia, unspecified: Secondary | ICD-10-CM

## 2017-01-25 DIAGNOSIS — K2930 Chronic superficial gastritis without bleeding: Secondary | ICD-10-CM | POA: Diagnosis not present

## 2017-01-25 DIAGNOSIS — K2950 Unspecified chronic gastritis without bleeding: Secondary | ICD-10-CM | POA: Diagnosis not present

## 2017-01-25 DIAGNOSIS — K2970 Gastritis, unspecified, without bleeding: Secondary | ICD-10-CM | POA: Diagnosis not present

## 2017-01-25 DIAGNOSIS — Z87891 Personal history of nicotine dependence: Secondary | ICD-10-CM | POA: Diagnosis not present

## 2017-01-25 DIAGNOSIS — K228 Other specified diseases of esophagus: Secondary | ICD-10-CM | POA: Diagnosis not present

## 2017-01-25 DIAGNOSIS — F909 Attention-deficit hyperactivity disorder, unspecified type: Secondary | ICD-10-CM | POA: Insufficient documentation

## 2017-01-25 HISTORY — DX: Torticollis: M43.6

## 2017-01-25 HISTORY — PX: ESOPHAGOGASTRODUODENOSCOPY (EGD) WITH PROPOFOL: SHX5813

## 2017-01-25 HISTORY — DX: Unspecified osteoarthritis, unspecified site: M19.90

## 2017-01-25 SURGERY — ESOPHAGOGASTRODUODENOSCOPY (EGD) WITH PROPOFOL
Anesthesia: General

## 2017-01-25 MED ORDER — LIDOCAINE HCL (CARDIAC) 20 MG/ML IV SOLN
INTRAVENOUS | Status: DC | PRN
  Administered 2017-01-25: 40 mg via INTRAVENOUS

## 2017-01-25 MED ORDER — PROPOFOL 10 MG/ML IV BOLUS
INTRAVENOUS | Status: DC | PRN
  Administered 2017-01-25: 140 mg via INTRAVENOUS
  Administered 2017-01-25: 60 mg via INTRAVENOUS
  Administered 2017-01-25: 80 mg via INTRAVENOUS

## 2017-01-25 MED ORDER — ACETAMINOPHEN 160 MG/5ML PO SOLN: 325.0000 mg | ORAL | Status: DC | PRN

## 2017-01-25 MED ORDER — ACETAMINOPHEN 325 MG PO TABS: 325.0000 mg | ORAL_TABLET | ORAL | Status: DC | PRN

## 2017-01-25 MED ORDER — STERILE WATER FOR IRRIGATION IR SOLN
Status: DC | PRN
  Administered 2017-01-25: 12:00:00

## 2017-01-25 MED ORDER — LACTATED RINGERS IV SOLN
INTRAVENOUS | Status: DC
  Administered 2017-01-25: 11:00:00 via INTRAVENOUS

## 2017-01-25 SURGICAL SUPPLY — 32 items

## 2017-01-25 NOTE — Anesthesia Procedure Notes (Signed)
Procedure Name: General with mask airway Performed by: Dung Prien A Pre-anesthesia Checklist: Patient identified, Patient being monitored, Emergency Drugs available, Timeout performed and Suction available Patient Re-evaluated:Patient Re-evaluated prior to induction Oxygen Delivery Method: Nasal cannula Preoxygenation: Pre-oxygenation with 100% oxygen Induction Type: Combination inhalational/ intravenous induction Ventilation: Mask ventilation without difficulty Dental Injury: Teeth and Oropharynx as per pre-operative assessment        

## 2017-01-25 NOTE — Transfer of Care (Signed)
Immediate Anesthesia Transfer of Care Note  Patient: Amber MeiersJulie M Santana  Procedure(s) Performed: Procedure(s): ESOPHAGOGASTRODUODENOSCOPY (EGD) WITH PROPOFOL (N/A)  Patient Location: PACU  Anesthesia Type: General  Level of Consciousness: awake, alert  and patient cooperative  Airway and Oxygen Therapy: Patient Spontanous Breathing and Patient connected to supplemental oxygen  Post-op Assessment: Post-op Vital signs reviewed, Patient's Cardiovascular Status Stable, Respiratory Function Stable, Patent Airway and No signs of Nausea or vomiting  Post-op Vital Signs: Reviewed and stable  Complications: No apparent anesthesia complications

## 2017-01-25 NOTE — H&P (Signed)
[name], MD [facility] [address] [place], [place] [address] Phone:[phone] Fax : [phone]  Primary Care Physician:  Particia Nearing, [place] Primary Gastroenterologist:  Dr. Servando Snare  Pre-Procedure History & Physical: HPI:  Amber Santana is a 36 y.o. female is here for an endoscopy.   Past Medical History:  Diagnosis Date  . ADHD (attention deficit hyperactivity disorder)   . Arthritis    lower back  . Lumbago   . Migraines    1x/mo  . Neck stiffness    limited side to side mvmt.  OK up and down.  . Neuropathy     Past Surgical History:  Procedure Laterality Date  . CERVICAL FUSION  07/2010   [facility], Dr Gerrit Heck  . CYSTOSCOPY      Prior to Admission medications   Medication Sig Start Date End Date Taking? Authorizing Provider  amphetamine-dextroamphetamine (ADDERALL) 20 MG tablet Take 1 tablet (20 mg total) by mouth 2 (two) times daily. 01/15/17  Yes Particia Nearing, PA-C  cyclobenzaprine (FLEXERIL) 10 MG tablet Take 1 tablet (10 mg total) by mouth 3 (three) times daily as needed for muscle spasms. 01/11/17  Yes Crissman, Redge Gainer, MD  dexlansoprazole (DEXILANT) 60 MG capsule Take 60 mg by mouth daily.   Yes [provider]  gabapentin (NEURONTIN) 100 MG capsule One by mouth every morning, and three by mouth at bedtime 09/05/15  Yes Lada, Janit Bern, MD  gabapentin (NEURONTIN) 300 MG capsule Take 1 capsule (300 mg total) by mouth 3 (three) times daily. Patient taking differently: Take 300 mg by mouth 3 (three) times daily. Takes 600 mg at bedtime 01/15/17  Yes Particia Nearing, PA-C  medroxyPROGESTERone (DEPO-PROVERA) 150 MG/ML injection Inject 150 mg into the muscle every 3 (three) months.   Yes [provider]  pantoprazole (PROTONIX) 40 MG tablet Take 1 tablet (40 mg total) by mouth daily. Patient not taking: Reported on 01/21/2017 01/08/17   Faythe Ghee, PA-C    Allergies as of 01/20/2017  . (No Known Allergies)     Family History  Problem Relation Age of Onset  . Asthma Brother   . Anemia Brother   . Diabetes Maternal Grandfather   . Heart disease Maternal Grandfather   . Cancer Neg Hx   . COPD Neg Hx   . Stroke Neg Hx     Social History   Social History  . Marital status: Married    Spouse name: N/A  . Number of children: N/A  . Years of education: N/A   Occupational History  . Not on file.   Social History Main Topics  . Smoking status: Former Smoker    Packs/day: 1.00    Years: 10.00    Types: Cigarettes    Quit date: 07/07/2008  . Smokeless tobacco: Never Used  . Alcohol use 3.0 oz/week    5 Glasses of wine per week  . Drug use: No  . Sexual activity: Not on file   Other Topics Concern  . Not on file   Social History Narrative  . No narrative on file    Review of Systems: See HPI, otherwise negative ROS  Physical Exam: Ht 5\' 4"  (1.626 m)   Wt 147 lb 8 oz (66.9 kg)   BMI 25.32 kg/m  General:   Alert,  pleasant and cooperative in NAD Head:  Normocephalic and atraumatic. Neck:  Supple; no masses or thyromegaly. Lungs:  Clear throughout to auscultation.    Heart:  Regular rate and rhythm. Abdomen:  Soft, nontender and nondistended. Normal bowel sounds, without guarding, and without rebound.   Neurologic:  Alert and  oriented x4;  grossly normal neurologically.  Impression/Plan: Marchia MeiersJulie M Englert is here for an endoscopy to be performed for dysphagia  Risks, benefits, limitations, and alternatives regarding  endoscopy have been reviewed with the patient.  Questions have been answered.  All parties agreeable.   Midge Miniumarren Teofila Bowery, MD  01/25/2017, 10:27 AM

## 2017-01-25 NOTE — Op Note (Signed)
[facility] Gastroenterology Patient Name: Amber MonksJulie Santana Procedure Date: 01/25/2017 11:31 AM MRN: [id number] Account #: [id number] Date of Birth: 10/23/1980 Admit Type: Outpatient Age: 36 [facility] 01 Gender: Female Note Status: Finalized Procedure:            Upper GI endoscopy Indications:          Dysphagia Providers:            Midge Miniumarren Phila Shoaf MD, MD Referring MD:         Bevely PalmerElizabeth C. Lane (Referring MD) Medicines:            Propofol per Anesthesia Complications:        No immediate complications. Procedure:            Pre-Anesthesia Assessment:                       - Prior to the procedure, a History and Physical was                        performed, and patient medications and allergies were                        reviewed. The patient's tolerance of previous                        anesthesia was also reviewed. The risks and benefits of                        the procedure and the sedation options and risks were                        discussed with the patient. All questions were                        answered, and informed consent was obtained. Prior                        Anticoagulants: The patient has taken no previous                        anticoagulant or antiplatelet agents. ASA Grade                        Assessment: II - A patient with mild systemic disease.                        After reviewing the risks and benefits, the patient was                        deemed in satisfactory condition to undergo the                        procedure.                       After obtaining informed consent, the endoscope was                        passed under direct vision. Throughout the procedure,  the patient's blood pressure, pulse, and oxygen                        saturations were monitored continuously. The Olympus                        GIF H180J Endoscope (J#:[id number]) was introduced through                        the  mouth, and advanced to the second part of duodenum.                        The upper GI endoscopy was accomplished without                        difficulty. The patient tolerated the procedure well. Findings:      The examined esophagus was normal. Two biopsies were obtained in the       middle third of the esophagus with cold forceps for histology.      Localized mild inflammation characterized by erythema was found in the       gastric body and in the gastric antrum. Biopsies were taken with a cold       forceps for histology.      The examined duodenum was normal. Impression:           - Normal esophagus.                       - Gastritis. Biopsied.                       - Normal examined duodenum.                       - Two biopsies were obtained in the middle third of the                        esophagus. Recommendation:       - Discharge patient to home.                       - Resume previous diet.                       - Continue present medications.                       - Await pathology results. Procedure Code(s):    --- Professional ---                       [phone], Esophagogastroduodenoscopy, flexible, transoral;                        with biopsy, single or multiple Diagnosis Code(s):    --- Professional ---                       R13.10, Dysphagia, unspecified                       K29.70, Gastritis, unspecified, without bleeding CPT copyright 2016 American Medical Association. All rights reserved. The codes documented in this report are preliminary and upon coder  review may  be revised to meet current compliance requirements. [name] MD, MD 01/25/2017 11:45:18 AM This report has been signed electronically. Number of Addenda: 0 Note Initiated On: 01/25/2017 11:31 AM      [facility]

## 2017-01-25 NOTE — Anesthesia Postprocedure Evaluation (Signed)
Anesthesia Post Note  Patient: Amber MeiersJulie M Santana  Procedure(s) Performed: Procedure(s) (LRB): ESOPHAGOGASTRODUODENOSCOPY (EGD) WITH PROPOFOL (N/A)  Patient location during evaluation: PACU Anesthesia Type: General Level of consciousness: awake and alert and oriented Pain management: satisfactory to patient Vital Signs Assessment: post-procedure vital signs reviewed and stable Respiratory status: spontaneous breathing, nonlabored ventilation and respiratory function stable Cardiovascular status: blood pressure returned to baseline and stable Postop Assessment: Adequate PO intake and No signs of nausea or vomiting Anesthetic complications: no    Cherly BeachStella, Alira Fretwell J

## 2017-01-25 NOTE — Discharge Instructions (Signed)
General Anesthesia, Adult, Care After °These instructions provide you with information about caring for yourself after your procedure. Your health care provider may also give you more specific instructions. Your treatment has been planned according to current medical practices, but problems sometimes occur. Call your health care provider if you have any problems or questions after your procedure. °What can I expect after the procedure? °After the procedure, it is common to have: °· Vomiting. °· A sore throat. °· Mental slowness. ° °It is common to feel: °· Nauseous. °· Cold or shivery. °· Sleepy. °· Tired. °· Sore or achy, even in parts of your body where you did not have surgery. ° °Follow these instructions at home: °For at least 24 hours after the procedure: °· Do not: °? Participate in activities where you could fall or become injured. °? Drive. °? Use heavy machinery. °? Drink alcohol. °? Take sleeping pills or medicines that cause drowsiness. °? Make important decisions or sign legal documents. °? Take care of children on your own. °· Rest. °Eating and drinking °· If you vomit, drink water, juice, or soup when you can drink without vomiting. °· Drink enough fluid to keep your urine clear or pale yellow. °· Make sure you have little or no nausea before eating solid foods. °· Follow the diet recommended by your health care provider. °General instructions °· Have a responsible adult stay with you until you are awake and alert. °· Return to your normal activities as told by your health care provider. Ask your health care provider what activities are safe for you. °· Take over-the-counter and prescription medicines only as told by your health care provider. °· If you smoke, do not smoke without supervision. °· Keep all follow-up visits as told by your health care provider. This is important. °Contact a health care provider if: °· You continue to have nausea or vomiting at home, and medicines are not helpful. °· You  cannot drink fluids or start eating again. °· You cannot urinate after 8-12 hours. °· You develop a skin rash. °· You have fever. °· You have increasing redness at the site of your procedure. °Get help right away if: °· You have difficulty breathing. °· You have chest pain. °· You have unexpected bleeding. °· You feel that you are having a life-threatening or urgent problem. °This information is not intended to replace advice given to you by your health care provider. Make sure you discuss any questions you have with your health care provider. °Document Released: 10/05/2000 Document Revised: 12/02/2015 Document Reviewed: 06/13/2015 °Elsevier Interactive Patient Education © 2018 [employer]. ° °

## 2017-01-25 NOTE — Anesthesia Preprocedure Evaluation (Signed)
Anesthesia Evaluation  Patient identified by MRN, date of birth, ID band Patient awake    Reviewed: Allergy & Precautions, H&P , NPO status , Patient's Chart, lab work & pertinent test results  Airway Mallampati: II  TM Distance: >3 FB Neck ROM: full    Dental no notable dental hx.    Pulmonary former smoker,    Pulmonary exam normal breath sounds clear to auscultation       Cardiovascular Normal cardiovascular exam Rhythm:regular Rate:Normal     Neuro/Psych  Headaches, PSYCHIATRIC DISORDERS    GI/Hepatic   Endo/Other    Renal/GU      Musculoskeletal   Abdominal   Peds  Hematology   Anesthesia Other Findings   Reproductive/Obstetrics                             Anesthesia Physical Anesthesia Plan  ASA: II  Anesthesia Plan: General   Post-op Pain Management:    Induction:   PONV Risk Score and Plan: 3 and Propofol  Airway Management Planned:   Additional Equipment:   Intra-op Plan:   Post-operative Plan:   Informed Consent: I have reviewed the patients History and Physical, chart, labs and discussed the procedure including the risks, benefits and alternatives for the proposed anesthesia with the patient or authorized representative who has indicated his/her understanding and acceptance.     Plan Discussed with: CRNA  Anesthesia Plan Comments:         Anesthesia Quick Evaluation

## 2017-01-26 ENCOUNTER — Encounter: Payer: Self-pay | Admitting: Gastroenterology

## 2017-01-27 ENCOUNTER — Encounter: Payer: Self-pay | Admitting: Gastroenterology

## 2017-01-29 ENCOUNTER — Telehealth: Payer: Self-pay

## 2017-01-29 ENCOUNTER — Other Ambulatory Visit: Payer: Self-pay

## 2017-01-29 DIAGNOSIS — R1011 Right upper quadrant pain: Secondary | ICD-10-CM

## 2017-01-29 MED ORDER — SUCRALFATE 1 GM/10ML PO SUSP: 1.0000 g | Freq: Four times a day (QID) | ORAL | 1 refills | Status: DC

## 2017-01-29 NOTE — Telephone Encounter (Signed)
Pt notified of EGD results.  

## 2017-01-29 NOTE — Telephone Encounter (Signed)
-----   Message from Midge Miniumarren Wohl, MD sent at 01/28/2017  6:28 AM EDT ----- Let the patient know that her biopsies of her stomach showed inflammation but no infection.  The esophagus biopsy showed signs of reflux in the middle of the esophagus.  If the patient is on a PPI she should have the dose increased or switch to another PPI.  If she is not she should be started on a PPI.

## 2017-02-01 ENCOUNTER — Other Ambulatory Visit: Payer: Self-pay

## 2017-02-01 MED ORDER — SUCRALFATE 1 G PO TABS: 1.0000 g | ORAL_TABLET | Freq: Four times a day (QID) | ORAL | 1 refills | Status: DC

## 2017-02-02 ENCOUNTER — Other Ambulatory Visit
Admission: RE | Admit: 2017-02-02 | Discharge: 2017-02-02 | Disposition: A | Discharge status: Home / Self Care | Payer: 59 | Source: Ambulatory Visit | Attending: Gastroenterology | Admitting: Gastroenterology

## 2017-02-02 DIAGNOSIS — R1011 Right upper quadrant pain: Secondary | ICD-10-CM | POA: Diagnosis not present

## 2017-02-02 LAB — HEPATIC FUNCTION PANEL
ALBUMIN: 4.5 g/dL (ref 3.5–5.0)
ALK PHOS: 50 U/L (ref 38–126)
ALT: 16 U/L (ref 14–54)
AST: 18 U/L (ref 15–41)
Bilirubin, Direct: <0.1 mg/dL — ABNORMAL LOW (ref 0.1–0.5)
TOTAL PROTEIN: 7.4 g/dL (ref 6.5–8.1)
Total Bilirubin: 0.7 mg/dL (ref 0.3–1.2)

## 2017-02-03 ENCOUNTER — Telehealth: Payer: Self-pay

## 2017-02-03 NOTE — Telephone Encounter (Signed)
Pt notified of lab results

## 2017-02-03 NOTE — Telephone Encounter (Signed)
-----   Message from Wyline MoodKiran Anna, MD sent at 02/03/2017 10:18 AM EDT ----- Wadsworth Skolnick her LFT's are normal. Ensure she is on PPI/carafate for pain, if no better then will try bentyl and IB guard. Follow up when Dr Servando SnareWohl gets back

## 2017-02-05 ENCOUNTER — Other Ambulatory Visit: Payer: Self-pay

## 2017-02-05 MED ORDER — SUCRALFATE 1 G PO TABS: 1.0000 g | ORAL_TABLET | Freq: Four times a day (QID) | ORAL | 1 refills | Status: DC

## 2017-02-10 ENCOUNTER — Other Ambulatory Visit: Payer: Self-pay

## 2017-02-10 ENCOUNTER — Ambulatory Visit: Payer: 59

## 2017-02-16 ENCOUNTER — Encounter: Admission: RE | Admit: 2017-02-16 | Payer: 59 | Source: Ambulatory Visit

## 2017-02-16 ENCOUNTER — Ambulatory Visit
Admission: RE | Admit: 2017-02-16 | Discharge: 2017-02-16 | Disposition: A | Discharge status: Home / Self Care | Payer: 59 | Source: Ambulatory Visit | Attending: Gastroenterology | Admitting: Gastroenterology

## 2017-02-16 DIAGNOSIS — R1011 Right upper quadrant pain: Secondary | ICD-10-CM | POA: Diagnosis not present

## 2017-02-17 ENCOUNTER — Telehealth: Payer: Self-pay

## 2017-02-17 NOTE — Telephone Encounter (Signed)
Pt notified of US results. Pt is still experiencing discomfort. Pt was given Carafate by Dr. Tobi BastosAnna, but this is not working. Please advise.

## 2017-02-17 NOTE — Telephone Encounter (Signed)
-----   Message from Amber MoodKiran Anna, MD sent at 02/17/2017 11:46 AM EDT ----- Patient of Dr Servando SnareWohl , USG ordered when he was away - [name] inform patient was normal

## 2017-03-11 ENCOUNTER — Encounter: Payer: Self-pay | Admitting: Family Medicine

## 2017-03-11 ENCOUNTER — Telehealth: Payer: Self-pay | Admitting: Family Medicine

## 2017-03-11 MED ORDER — AMPHETAMINE-DEXTROAMPHETAMINE 20 MG PO TABS: 20.0000 mg | ORAL_TABLET | Freq: Two times a day (BID) | ORAL | 0 refills | Status: DC

## 2017-03-11 NOTE — Telephone Encounter (Signed)
Please call and let her know her script is ready for pick up

## 2017-03-11 NOTE — Telephone Encounter (Signed)
Called patient to let her know the script is ready to be picked up. Placed in file.

## 2017-04-22 ENCOUNTER — Other Ambulatory Visit: Payer: Self-pay | Admitting: Family Medicine

## 2017-04-22 MED ORDER — AMPHETAMINE-DEXTROAMPHETAMINE 20 MG PO TABS: 20.0000 mg | ORAL_TABLET | Freq: Two times a day (BID) | ORAL | 0 refills | Status: DC

## 2017-04-22 NOTE — Telephone Encounter (Signed)
Patient picked up prescription before lunch.

## 2017-05-05 ENCOUNTER — Encounter: Payer: Self-pay | Admitting: Family Medicine

## 2017-05-05 ENCOUNTER — Ambulatory Visit (INDEPENDENT_AMBULATORY_CARE_PROVIDER_SITE_OTHER): Payer: 59 | Admitting: Family Medicine

## 2017-05-05 ENCOUNTER — Telehealth: Payer: Self-pay | Admitting: Family Medicine

## 2017-05-05 VITALS — BP 115/79 | HR 74 | Temp 98.9°F | Ht 64.5 in | Wt 147.7 lb

## 2017-05-05 DIAGNOSIS — Z0001 Encounter for general adult medical examination with abnormal findings: Secondary | ICD-10-CM

## 2017-05-05 DIAGNOSIS — Z0000 Encounter for general adult medical examination without abnormal findings: Secondary | ICD-10-CM

## 2017-05-05 DIAGNOSIS — Z3042 Encounter for surveillance of injectable contraceptive: Secondary | ICD-10-CM

## 2017-05-05 DIAGNOSIS — R8789 Other abnormal findings in specimens from female genital organs: Secondary | ICD-10-CM | POA: Diagnosis not present

## 2017-05-05 DIAGNOSIS — I7300 Raynaud's syndrome without gangrene: Secondary | ICD-10-CM

## 2017-05-05 DIAGNOSIS — R87618 Other abnormal cytological findings on specimens from cervix uteri: Secondary | ICD-10-CM

## 2017-05-05 DIAGNOSIS — F909 Attention-deficit hyperactivity disorder, unspecified type: Secondary | ICD-10-CM

## 2017-05-05 DIAGNOSIS — N898 Other specified noninflammatory disorders of vagina: Secondary | ICD-10-CM

## 2017-05-05 LAB — MICROSCOPIC EXAMINATION

## 2017-05-05 LAB — UA/M W/RFLX CULTURE, ROUTINE
BILIRUBIN UA: NEGATIVE
Glucose, UA: NEGATIVE
Ketones, UA: NEGATIVE
NITRITE UA: NEGATIVE
PH UA: 8.5 — AB (ref 5.0–7.5)
RBC UA: NEGATIVE
Specific Gravity, UA: 1.015 (ref 1.005–1.030)
UUROB: 0.2 mg/dL (ref 0.2–1.0)

## 2017-05-05 LAB — WET PREP FOR TRICH, YEAST, CLUE
CLUE CELL EXAM: POSITIVE — AB
TRICHOMONAS EXAM: NEGATIVE
YEAST EXAM: NEGATIVE

## 2017-05-05 MED ORDER — GABAPENTIN 300 MG PO CAPS: 300.0000 mg | ORAL_CAPSULE | Freq: Three times a day (TID) | ORAL | 1 refills | Status: DC

## 2017-05-05 MED ORDER — METRONIDAZOLE 500 MG PO TABS: 500.0000 mg | ORAL_TABLET | Freq: Two times a day (BID) | ORAL | 0 refills | Status: DC

## 2017-05-05 MED ORDER — CYCLOBENZAPRINE HCL 10 MG PO TABS: 10.0000 mg | ORAL_TABLET | Freq: Three times a day (TID) | ORAL | 1 refills | Status: DC | PRN

## 2017-05-05 NOTE — Telephone Encounter (Signed)
Patient called and changed her pharmacy- it has been updated in the chart. She would like her new prescriptions sent to the new pharmacy. Not sure if it is OK for me to do- so I am sending this back to the office.

## 2017-05-05 NOTE — Telephone Encounter (Signed)
Routing to provider. Can we please send gabapentin, flexeril, and flagyl to the updated pharmacy please?

## 2017-05-05 NOTE — Telephone Encounter (Signed)
Patient notified

## 2017-05-05 NOTE — Progress Notes (Signed)
BP 115/79 (BP Location: Right Arm, Patient Position: Sitting, Cuff Size: Normal)   Pulse 74   Temp 98.9 F (37.2 C)   Ht 5' 4.5" (1.638 m)   Wt 147 lb 11.2 oz (67 kg)   SpO2 100%   BMI 24.96 kg/m    Subjective:    Patient ID: Amber Santana, female    DOB: 11-May-1981, 36 y.o.   MRN: [id number]  HPI: Amber Santana is a 36 y.o. female presenting on 05/05/2017 for comprehensive medical examination. 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 Will turn blue, purple, or yellow if not completely warm and tingle and burn. Also has intermittent b/l knee and ankle pain. Wanting to be tested for other rheumatologic disease.   Flu shot on October 5th.   Was previously on depo injection. Currently overdue for next one but wanting to wait until next insurance cycle to restart them.   Continues to do well on current adderall dose. No side effects, and notes major benefit in her ability to focus and get her work done.   Depression Screen done today and results listed below:  No flowsheet data found.  The patient does not have a history of falls. I did not complete a risk assessment for falls. A plan of care for falls was not documented.   Past Medical History:  Past Medical History:  Diagnosis Date  . ADHD (attention deficit hyperactivity disorder)   . Arthritis    lower back  . Lumbago   . Migraines    1x/mo  . Neck stiffness    limited side to side mvmt.  OK up and down.  . Neuropathy     Surgical History:  Past Surgical History:  Procedure Laterality Date  . CERVICAL FUSION  07/2010   [facility], Dr Mauri Pole  . CYSTOSCOPY    . ESOPHAGOGASTRODUODENOSCOPY (EGD) WITH PROPOFOL N/A 01/25/2017   Procedure: ESOPHAGOGASTRODUODENOSCOPY (EGD) WITH PROPOFOL;  Surgeon: Lucilla Lame, MD;  Location: [place] [facility];  Service: Endoscopy;  Laterality: N/A;    Medications:  Current Outpatient Prescriptions on File Prior to Visit  Medication Sig  .  amphetamine-dextroamphetamine (ADDERALL) 20 MG tablet Take 1 tablet (20 mg total) by mouth 2 (two) times daily.  . pantoprazole (PROTONIX) 40 MG tablet Take 1 tablet (40 mg total) by mouth daily.  . medroxyPROGESTERone (DEPO-PROVERA) 150 MG/ML injection Inject 150 mg into the muscle every 3 (three) months.   No current facility-administered medications on file prior to visit.     Allergies:  Allergies  Allergen Reactions  . Banana     Stomach pain    Social History:  Social History   Social History  . Marital status: Married    Spouse name: N/A  . Number of children: N/A  . Years of education: N/A   Occupational History  . Not on file.   Social History Main Topics  . Smoking status: Former Smoker    Packs/day: 1.00    Years: 10.00    Types: Cigarettes    Quit date: 07/07/2008  . Smokeless tobacco: Never Used  . Alcohol use 3.0 oz/week    5 Glasses of wine per week  . Drug use: No  . Sexual activity: Not on file   Other Topics Concern  . Not on file   Social History Narrative  . No narrative on file   History  Smoking Status  . Former Smoker  . Packs/day: 1.00  . Years: 10.00  .  Types: Cigarettes  . Quit date: 07/07/2008  Smokeless Tobacco  . Never Used   History  Alcohol Use  . 3.0 oz/week  . 5 Glasses of wine per week    Family History:  Family History  Problem Relation Age of Onset  . Asthma Brother   . Anemia Brother   . Diabetes Maternal Grandfather   . Heart disease Maternal Grandfather   . Cancer Neg Hx   . COPD Neg Hx   . Stroke Neg Hx     Past medical history, surgical history, medications, allergies, family history and social history reviewed with patient today and changes made to appropriate areas of the chart.   Review of Systems - General ROS: negative Psychological ROS: negative Ophthalmic ROS: negative ENT ROS: negative Endocrine ROS: negative Breast ROS: negative for breast lumps Respiratory ROS: no cough, shortness of  breath, or wheezing Cardiovascular ROS: no chest pain or dyspnea on exertion Gastrointestinal ROS: no abdominal pain, change in bowel habits, or black or bloody stools Genito-Urinary ROS: positive for - genital discharge Musculoskeletal ROS: positive for - joint pain Neurological ROS: no TIA or stroke symptoms Dermatological ROS: negative All other ROS negative except what is listed above and in the HPI.      Objective:    BP 115/79 (BP Location: Right Arm, Patient Position: Sitting, Cuff Size: Normal)   Pulse 74   Temp 98.9 F (37.2 C)   Ht 5' 4.5" (1.638 m)   Wt 147 lb 11.2 oz (67 kg)   SpO2 100%   BMI 24.96 kg/m   Wt Readings from Last 3 Encounters:  05/05/17 147 lb 11.2 oz (67 kg)  01/25/17 146 lb (66.2 kg)  01/20/17 147 lb 8 oz (66.9 kg)    Physical Exam  Constitutional: She is oriented to person, place, and time. She appears well-developed and well-nourished. No distress.  HENT:  Head: Atraumatic.  Right Ear: External ear normal.  Left Ear: External ear normal.  Nose: Nose normal.  Mouth/Throat: Oropharynx is clear and moist. No oropharyngeal exudate.  Eyes: Pupils are equal, round, and reactive to light. Conjunctivae are normal. No scleral icterus.  Neck: Normal range of motion. Neck supple. No thyromegaly present.  Cardiovascular: Normal rate, regular rhythm, normal heart sounds and intact distal pulses.   Pulmonary/Chest: Effort normal and breath sounds normal. No respiratory distress. Right breast exhibits no mass and no skin change. Left breast exhibits no mass and no skin change. Breasts are symmetrical.  Abdominal: Soft. Bowel sounds are normal. She exhibits no mass. There is no tenderness.  Genitourinary: Vaginal discharge found.  Musculoskeletal: Normal range of motion. She exhibits no edema or tenderness.  Lymphadenopathy:    She has no cervical adenopathy.    She has no axillary adenopathy.  Neurological: She is alert and oriented to person, place, and  time. No cranial nerve deficit.  Skin: Skin is warm and dry. No rash noted.  Psychiatric: She has a normal mood and affect. Her behavior is normal.  Nursing note and vitals reviewed.  Results for orders placed or performed in visit on 05/05/17  WET PREP FOR [place], YEAST, CLUE  Result Value Ref Range   Trichomonas Exam Negative Negative   Yeast Exam Negative Negative   Clue Cell Exam Positive (A) Negative  Microscopic Examination  Result Value Ref Range   WBC, UA 0-5 0 - 5 /hpf   RBC, UA 0-2 0 - 2 /hpf   Epithelial Cells (non renal) 0-10 0 - 10 /  hpf   Renal Epithel, UA 0-10 (A) None seen /hpf   Bacteria, UA Few None seen/Few  Sed Rate (ESR)  Result Value Ref Range   Sed Rate 2 0 - 32 mm/hr  ANA w/Reflex  Result Value Ref Range   Anit Nuclear Antibody(ANA) Negative Negative  Rheumatoid Factor  Result Value Ref Range   Rhuematoid fact SerPl-aCnc <10.0 0.0 - 13.9 IU/mL  CBC with Differential/Platelet  Result Value Ref Range   WBC 4.9 3.4 - 10.8 x10E3/uL   RBC 4.77 3.77 - 5.28 x10E6/uL   Hemoglobin 13.6 11.1 - 15.9 g/dL   Hematocrit 42.6 34.0 - 46.6 %   MCV 89 79 - 97 fL   MCH 28.5 26.6 - 33.0 pg   MCHC 31.9 31.5 - 35.7 g/dL   RDW 12.9 12.3 - 15.4 %   Platelets 325 150 - 379 x10E3/uL   Neutrophils 46 Not Estab. %   Lymphs 41 Not Estab. %   Monocytes 10 Not Estab. %   Eos 3 Not Estab. %   Basos 0 Not Estab. %   Neutrophils Absolute 2.2 1.4 - 7.0 x10E3/uL   Lymphocytes Absolute 2.0 0.7 - 3.1 x10E3/uL   Monocytes Absolute 0.5 0.1 - 0.9 x10E3/uL   EOS (ABSOLUTE) 0.1 0.0 - 0.4 x10E3/uL   Basophils Absolute 0.0 0.0 - 0.2 x10E3/uL   Immature Granulocytes 0 Not Estab. %   Immature Grans (Abs) 0.0 0.0 - 0.1 x10E3/uL  Comprehensive metabolic panel  Result Value Ref Range   Glucose 93 65 - 99 mg/dL   BUN 11 6 - 20 mg/dL   Creatinine, Ser 0.83 0.57 - 1.00 mg/dL   GFR calc non Af Amer 90 >59 mL/min/1.73   GFR calc Af Amer 104 >59 mL/min/1.73   BUN/Creatinine Ratio 13 9 - 23    Sodium 141 134 - 144 mmol/L   Potassium 4.8 3.5 - 5.2 mmol/L   Chloride 102 96 - 106 mmol/L   CO2 24 20 - 29 mmol/L   Calcium 9.3 8.7 - 10.2 mg/dL   Total Protein 7.2 6.0 - 8.5 g/dL   Albumin 4.8 3.5 - 5.5 g/dL   Globulin, Total 2.4 1.5 - 4.5 g/dL   Albumin/Globulin Ratio 2.0 1.2 - 2.2   Bilirubin Total 0.3 0.0 - 1.2 mg/dL   Alkaline Phosphatase 59 39 - 117 IU/L   AST 19 0 - 40 IU/L   ALT 15 0 - 32 IU/L  Lipid Panel w/o Chol/HDL Ratio  Result Value Ref Range   Cholesterol, Total 185 100 - 199 mg/dL   Triglycerides 44 0 - 149 mg/dL   HDL 72 >39 mg/dL   VLDL Cholesterol Cal 9 5 - 40 mg/dL   LDL Calculated 104 (H) 0 - 99 mg/dL  TSH  Result Value Ref Range   TSH 2.050 0.450 - 4.500 uIU/mL  UA/M w/rflx Culture, Routine  Result Value Ref Range   Specific Gravity, UA 1.015 1.005 - 1.030   pH, UA 8.5 (H) 5.0 - 7.5   Color, UA Yellow Yellow   Appearance Ur Turbid (A) Clear   Leukocytes, UA Trace (A) Negative   Protein, UA 1+ (A) Negative/Trace   Glucose, UA Negative Negative   Ketones, UA Negative Negative   RBC, UA Negative Negative   Bilirubin, UA Negative Negative   Urobilinogen, Ur 0.2 0.2 - 1.0 mg/dL   Nitrite, UA Negative Negative   Microscopic Examination See below:   Pap IG and HPV (high risk) DNA detection  Result Value  Ref Range   DIAGNOSIS: Comment    Specimen adequacy: Comment    Clinician Provided ICD10 Comment    Performed by: Comment    QC reviewed by: Comment    PAP Smear Comment .    Note: Comment    Test Methodology Comment    HPV, high-risk Negative Negative      Assessment & Plan:   Problem List Items Addressed This Visit      Other   Adult ADHD (attention deficit hyperactivity disorder) (Chronic)    Doing very well on adderall, continue current regimen. Follow up in 3 months for recheck      Depo-Provera contraceptive status (Chronic)    Overdue at this time, wanting to wait for next insurance cycle to get back on track.       Vaginal  discharge    Noted during PAP. Wet prep revealed BV so flagyl was sent. Vaginal hygiene reviewed      Relevant Orders   WET PREP FOR [place], [place] (Completed)   Pap IG and HPV (high risk) DNA detection (Completed)    Other Visit Diagnoses    Raynaud's disease without gangrene    -  Primary   Check some basic inflamm/rheum labs. Discussed keeping extremities covered and warm. Wouldn't feel comfortable at this time trying CCBs for it given baseline BP   Relevant Orders   Sed Rate (ESR) (Completed)   ANA w/Reflex (Completed)   Rheumatoid Factor (Completed)   Annual physical exam       Relevant Orders   CBC with Differential/Platelet (Completed)   Comprehensive metabolic panel (Completed)   Lipid Panel w/o Chol/HDL Ratio (Completed)   TSH (Completed)   UA/M w/rflx Culture, Routine (Completed)   Pap smear abnormality of cervix/human papillomavirus (HPV) positive       Relevant Orders   Pap IG and HPV (high risk) DNA detection (Completed)       Follow up plan: Return in about 3 months (around 08/05/2017) for ADHD f/u.   LABORATORY TESTING:  - Pap smear: pap done  IMMUNIZATIONS:   - Tdap: Tetanus vaccination status reviewed: last tetanus booster within 10 years. - Influenza: Up to date  PATIENT COUNSELING:   Advised to take 1 mg of folate supplement per day if capable of pregnancy.   Sexuality: Discussed sexually transmitted diseases, partner selection, use of condoms, avoidance of unintended pregnancy  and contraceptive alternatives.   Advised to avoid cigarette smoking.  I discussed with the patient that most people either abstain from alcohol or drink within safe limits (<=14/week and <=4 drinks/occasion for males, <=7/weeks and <= 3 drinks/occasion for females) and that the risk for alcohol disorders and other health effects rises proportionally with the number of drinks per week and how often a drinker exceeds daily limits.  Discussed cessation/primary prevention of  drug use and availability of treatment for abuse.   Diet: Encouraged to adjust caloric intake to maintain  or achieve ideal body weight, to reduce intake of dietary saturated fat and total fat, to limit sodium intake by avoiding high sodium foods and not adding table salt, and to maintain adequate dietary potassium and calcium preferably from fresh fruits, vegetables, and low-fat dairy products.    stressed the importance of regular exercise  Injury prevention: Discussed safety belts, safety helmets, smoke detector, smoking near bedding or upholstery.   Dental health: Discussed importance of regular tooth brushing, flossing, and dental visits.    NEXT PREVENTATIVE PHYSICAL DUE IN 1 YEAR. Return  in about 3 months (around 08/05/2017) for ADHD f/u.

## 2017-05-05 NOTE — Telephone Encounter (Signed)
Please call and let her know that her wet prep was positive for BV. I've sent over some flagyl for her to take for a week. Avoid alcohol while taking the medication. Can start on probiotics long term to help keep vaginal flora balanced.

## 2017-05-05 NOTE — Telephone Encounter (Signed)
Copied from CRM #[id number]. Topic: Quick Communication - See Telephone Encounter >> May 05, 2017  3:17 PM Diana EvesHoyt, Maryann B wrote: CRM for notification. See Telephone encounter for:  05/05/17. Pt was seen today at office and had 3 meds called in for her. They where sent to the wrong pharmacy. I have updated the pharmacy information. Just needing the pharmacies notified   meds are gabapentin, flexa real, [place]

## 2017-05-06 LAB — CBC WITH DIFFERENTIAL/PLATELET
BASOS: 0 %
Basophils Absolute: 0 10*3/uL (ref 0.0–0.2)
EOS (ABSOLUTE): 0.1 10*3/uL (ref 0.0–0.4)
EOS: 3 %
HEMATOCRIT: 42.6 % (ref 34.0–46.6)
Hemoglobin: 13.6 g/dL (ref 11.1–15.9)
IMMATURE GRANS (ABS): 0 10*3/uL (ref 0.0–0.1)
IMMATURE GRANULOCYTES: 0 %
LYMPHS: 41 %
Lymphocytes Absolute: 2 10*3/uL (ref 0.7–3.1)
MCH: 28.5 pg (ref 26.6–33.0)
MCHC: 31.9 g/dL (ref 31.5–35.7)
MCV: 89 fL (ref 79–97)
MONOS ABS: 0.5 10*3/uL (ref 0.1–0.9)
Monocytes: 10 %
NEUTROS PCT: 46 %
Neutrophils Absolute: 2.2 10*3/uL (ref 1.4–7.0)
PLATELETS: 325 10*3/uL (ref 150–379)
RBC: 4.77 x10E6/uL (ref 3.77–5.28)
RDW: 12.9 % (ref 12.3–15.4)
WBC: 4.9 10*3/uL (ref 3.4–10.8)

## 2017-05-06 LAB — LIPID PANEL W/O CHOL/HDL RATIO
Cholesterol, Total: 185 mg/dL (ref 100–199)
HDL: 72 mg/dL (ref >39)
LDL CALC: 104 mg/dL — AB (ref 0–99)
TRIGLYCERIDES: 44 mg/dL (ref 0–149)
VLDL CHOLESTEROL CAL: 9 mg/dL (ref 5–40)

## 2017-05-06 LAB — COMPREHENSIVE METABOLIC PANEL
A/G RATIO: 2 (ref 1.2–2.2)
ALT: 15 IU/L (ref 0–32)
AST: 19 IU/L (ref 0–40)
Albumin: 4.8 g/dL (ref 3.5–5.5)
Alkaline Phosphatase: 59 IU/L (ref 39–117)
BUN/Creatinine Ratio: 13 (ref 9–23)
BUN: 11 mg/dL (ref 6–20)
Bilirubin Total: 0.3 mg/dL (ref 0.0–1.2)
CALCIUM: 9.3 mg/dL (ref 8.7–10.2)
CO2: 24 mmol/L (ref 20–29)
CREATININE: 0.83 mg/dL (ref 0.57–1.00)
Chloride: 102 mmol/L (ref 96–106)
GFR, EST AFRICAN AMERICAN: 104 mL/min/{1.73_m2} (ref >59)
GFR, EST NON AFRICAN AMERICAN: 90 mL/min/{1.73_m2} (ref >59)
Globulin, Total: 2.4 g/dL (ref 1.5–4.5)
Glucose: 93 mg/dL (ref 65–99)
Potassium: 4.8 mmol/L (ref 3.5–5.2)
Sodium: 141 mmol/L (ref 134–144)
TOTAL PROTEIN: 7.2 g/dL (ref 6.0–8.5)

## 2017-05-06 LAB — SEDIMENTATION RATE: SED RATE: 2 mm/h (ref 0–32)

## 2017-05-06 LAB — TSH: TSH: 2.05 u[IU]/mL (ref 0.450–4.500)

## 2017-05-06 LAB — RHEUMATOID FACTOR: Rhuematoid fact SerPl-aCnc: <10 IU/mL (ref 0.0–13.9)

## 2017-05-06 LAB — ANA W/REFLEX: Anti Nuclear Antibody(ANA): NEGATIVE

## 2017-05-06 MED ORDER — METRONIDAZOLE 500 MG PO TABS: 500.0000 mg | ORAL_TABLET | Freq: Two times a day (BID) | ORAL | 0 refills | Status: DC

## 2017-05-06 MED ORDER — CYCLOBENZAPRINE HCL 10 MG PO TABS: 10.0000 mg | ORAL_TABLET | Freq: Three times a day (TID) | ORAL | 1 refills | Status: DC | PRN

## 2017-05-06 MED ORDER — GABAPENTIN 300 MG PO CAPS: 300.0000 mg | ORAL_CAPSULE | Freq: Three times a day (TID) | ORAL | 1 refills | Status: DC

## 2017-05-06 NOTE — Telephone Encounter (Signed)
Rxs sent

## 2017-05-07 ENCOUNTER — Other Ambulatory Visit: Payer: Self-pay | Admitting: Family Medicine

## 2017-05-07 LAB — PAP IG AND HPV HIGH-RISK
HPV, HIGH-RISK: NEGATIVE
PAP SMEAR COMMENT: 0

## 2017-05-07 MED ORDER — FLUCONAZOLE 150 MG PO TABS: 150.0000 mg | ORAL_TABLET | Freq: Once | ORAL | 0 refills | Status: AC

## 2017-05-08 NOTE — Patient Instructions (Signed)
Follow up as needed

## 2017-05-08 NOTE — Assessment & Plan Note (Signed)
Noted during PAP. Wet prep revealed BV so flagyl was sent. Vaginal hygiene reviewed

## 2017-05-08 NOTE — Assessment & Plan Note (Signed)
Overdue at this time, wanting to wait for next insurance cycle to get back on track.

## 2017-05-08 NOTE — Assessment & Plan Note (Signed)
Doing very well on adderall, continue current regimen. Follow up in 3 months for recheck

## 2017-06-16 ENCOUNTER — Encounter: Payer: Self-pay | Admitting: Family Medicine

## 2017-06-17 ENCOUNTER — Other Ambulatory Visit: Payer: Self-pay | Admitting: Family Medicine

## 2017-06-17 MED ORDER — AMPHETAMINE-DEXTROAMPHETAMINE 20 MG PO TABS: 20.0000 mg | ORAL_TABLET | Freq: Two times a day (BID) | ORAL | 0 refills | Status: DC

## 2017-08-06 ENCOUNTER — Encounter: Payer: Self-pay | Admitting: Family Medicine

## 2017-08-06 ENCOUNTER — Ambulatory Visit (INDEPENDENT_AMBULATORY_CARE_PROVIDER_SITE_OTHER): Payer: No Typology Code available for payment source | Admitting: Family Medicine

## 2017-08-06 VITALS — BP 134/90 | HR 76 | Temp 98.5°F | Wt 148.1 lb

## 2017-08-06 DIAGNOSIS — N760 Acute vaginitis: Secondary | ICD-10-CM | POA: Diagnosis not present

## 2017-08-06 DIAGNOSIS — F909 Attention-deficit hyperactivity disorder, unspecified type: Secondary | ICD-10-CM

## 2017-08-06 DIAGNOSIS — B9689 Other specified bacterial agents as the cause of diseases classified elsewhere: Secondary | ICD-10-CM | POA: Diagnosis not present

## 2017-08-06 DIAGNOSIS — Z3042 Encounter for surveillance of injectable contraceptive: Secondary | ICD-10-CM | POA: Diagnosis not present

## 2017-08-06 LAB — PREGNANCY, URINE: PREG TEST UR: NEGATIVE

## 2017-08-06 MED ORDER — AMPHETAMINE-DEXTROAMPHET ER 20 MG PO CP24
20.0000 mg | ORAL_CAPSULE | Freq: Every day | ORAL | 0 refills | Status: DC
Start: 2017-08-06 — End: 2017-08-06

## 2017-08-06 MED ORDER — AMPHETAMINE-DEXTROAMPHET ER 20 MG PO CP24: 20.0000 mg | ORAL_CAPSULE | Freq: Every day | ORAL | 0 refills | Status: DC

## 2017-08-06 MED ORDER — METRONIDAZOLE 0.75 % VA GEL: 1.0000 | Freq: Every day | VAGINAL | 3 refills | Status: DC

## 2017-08-06 MED ORDER — MEDROXYPROGESTERONE ACETATE 150 MG/ML IM SUSP
150.0000 mg | Freq: Once | INTRAMUSCULAR | Status: AC
Start: 2017-08-06 — End: 2017-08-06
  Administered 2017-08-06: 150 mg via INTRAMUSCULAR

## 2017-08-06 NOTE — Patient Instructions (Signed)
Probiotic with lactobacillus 

## 2017-08-06 NOTE — Progress Notes (Signed)
BP 134/90   Pulse 76   Temp 98.5 F (36.9 C) (Oral)   Wt 148 lb 1.6 oz (67.2 kg)   LMP 07/12/2017   SpO2 100%   BMI 25.03 kg/m    Subjective:    Patient ID: Amber Santana, female    DOB: June 13, 1981, 37 y.o.   MRN: [id number]  HPI: Amber Santana is a 37 y.o. female  Chief Complaint  Patient presents with  . ADHD  . Contraception    pt wants to get back on depo, missed last injection'   Pt here today for ADHD f/u. Has been taking 20 mg adderall BID prn the past few months, states she felt better with the 20 mg XR dosing and wants to return to that. Denies side effects with the medication.   Last depo was about 6 months ago. Wanting to get back on. Having frequent bleeding now that she's off of it.   Still having discharge and vaginal odor despite flagyl course after BV. Has not started probiotics as she didn't know which ones to get. Denies dysuria, hematuria, pelvic pain, or concern for STDs.   Past Medical History:  Diagnosis Date  . ADHD (attention deficit hyperactivity disorder)   . Arthritis    lower back  . Lumbago   . Migraines    1x/mo  . Neck stiffness    limited side to side mvmt.  OK up and down.  . Neuropathy    Social History   Socioeconomic History  . Marital status: Married    Spouse name: Not on file  . Number of children: Not on file  . Years of education: Not on file  . Highest education level: Not on file  Social Needs  . Financial resource strain: Not on file  . Food insecurity - worry: Not on file  . Food insecurity - inability: Not on file  . Transportation needs - medical: Not on file  . Transportation needs - non-medical: Not on file  Occupational History  . Not on file  Tobacco Use  . Smoking status: Former Smoker    Packs/day: 1.00    Years: 10.00    Pack years: 10.00    Types: Cigarettes    Last attempt to quit: 07/07/2008    Years since quitting: 9.0  . Smokeless tobacco: Never Used  Substance and Sexual Activity  .  Alcohol use: Yes    Alcohol/week: 3.0 oz    Types: 5 Glasses of wine per week  . Drug use: No  . Sexual activity: Not on file  Other Topics Concern  . Not on file  Social History Narrative  . Not on file   Relevant past medical, surgical, family and social history reviewed and updated as indicated. Interim medical history since our last visit reviewed. Allergies and medications reviewed and updated.  Review of Systems  Constitutional: Negative.   HENT: Negative.   Respiratory: Negative.   Cardiovascular: Negative.   Gastrointestinal: Negative.   Genitourinary: Positive for vaginal discharge.  Musculoskeletal: Negative.   Neurological: Negative.   Psychiatric/Behavioral: Negative.    Per HPI unless specifically indicated above     Objective:    BP 134/90   Pulse 76   Temp 98.5 F (36.9 C) (Oral)   Wt 148 lb 1.6 oz (67.2 kg)   LMP 07/12/2017   SpO2 100%   BMI 25.03 kg/m   Wt Readings from Last 3 Encounters:  08/06/17 148 lb 1.6 oz (67.2 kg)  05/05/17 147 lb 11.2 oz (67 kg)  01/25/17 146 lb (66.2 kg)    Physical Exam  Constitutional: She is oriented to person, place, and time. She appears well-developed and well-nourished. No distress.  HENT:  Head: Atraumatic.  Eyes: Conjunctivae are normal. Pupils are equal, round, and reactive to light.  Neck: Normal range of motion. Neck supple.  Cardiovascular: Normal rate and normal heart sounds.  Pulmonary/Chest: Effort normal and breath sounds normal. No respiratory distress.  Abdominal: Soft. Bowel sounds are normal. There is no tenderness.  Musculoskeletal: Normal range of motion. She exhibits no tenderness (No CVA tenderness).  Neurological: She is alert and oriented to person, place, and time.  Skin: Skin is warm and dry.  Psychiatric: She has a normal mood and affect. Her behavior is normal.  Nursing note and vitals reviewed.  Results for orders placed or performed in visit on 05/05/17  WET PREP FOR [place], YEAST,  CLUE  Result Value Ref Range   Trichomonas Exam Negative Negative   Yeast Exam Negative Negative   Clue Cell Exam Positive (A) Negative  Microscopic Examination  Result Value Ref Range   WBC, UA 0-5 0 - 5 /hpf   RBC, UA 0-2 0 - 2 /hpf   Epithelial Cells (non renal) 0-10 0 - 10 /hpf   Renal Epithel, UA 0-10 (A) None seen /hpf   Bacteria, UA Few None seen/Few  Sed Rate (ESR)  Result Value Ref Range   Sed Rate 2 0 - 32 mm/hr  ANA w/Reflex  Result Value Ref Range   Anit Nuclear Antibody(ANA) Negative Negative  Rheumatoid Factor  Result Value Ref Range   Rhuematoid fact SerPl-aCnc <10.0 0.0 - 13.9 IU/mL  CBC with Differential/Platelet  Result Value Ref Range   WBC 4.9 3.4 - 10.8 x10E3/uL   RBC 4.77 3.77 - 5.28 x10E6/uL   Hemoglobin 13.6 11.1 - 15.9 g/dL   Hematocrit 42.6 34.0 - 46.6 %   MCV 89 79 - 97 fL   MCH 28.5 26.6 - 33.0 pg   MCHC 31.9 31.5 - 35.7 g/dL   RDW 12.9 12.3 - 15.4 %   Platelets 325 150 - 379 x10E3/uL   Neutrophils 46 Not Estab. %   Lymphs 41 Not Estab. %   Monocytes 10 Not Estab. %   Eos 3 Not Estab. %   Basos 0 Not Estab. %   Neutrophils Absolute 2.2 1.4 - 7.0 x10E3/uL   Lymphocytes Absolute 2.0 0.7 - 3.1 x10E3/uL   Monocytes Absolute 0.5 0.1 - 0.9 x10E3/uL   EOS (ABSOLUTE) 0.1 0.0 - 0.4 x10E3/uL   Basophils Absolute 0.0 0.0 - 0.2 x10E3/uL   Immature Granulocytes 0 Not Estab. %   Immature Grans (Abs) 0.0 0.0 - 0.1 x10E3/uL  Comprehensive metabolic panel  Result Value Ref Range   Glucose 93 65 - 99 mg/dL   BUN 11 6 - 20 mg/dL   Creatinine, Ser 0.83 0.57 - 1.00 mg/dL   GFR calc non Af Amer 90 >59 mL/min/1.73   GFR calc Af Amer 104 >59 mL/min/1.73   BUN/Creatinine Ratio 13 9 - 23   Sodium 141 134 - 144 mmol/L   Potassium 4.8 3.5 - 5.2 mmol/L   Chloride 102 96 - 106 mmol/L   CO2 24 20 - 29 mmol/L   Calcium 9.3 8.7 - 10.2 mg/dL   Total Protein 7.2 6.0 - 8.5 g/dL   Albumin 4.8 3.5 - 5.5 g/dL   Globulin, Total 2.4 1.5 - 4.5 g/dL   Albumin/Globulin  Ratio 2.0 1.2 - 2.2   Bilirubin Total 0.3 0.0 - 1.2 mg/dL   Alkaline Phosphatase 59 39 - 117 IU/L   AST 19 0 - 40 IU/L   ALT 15 0 - 32 IU/L  Lipid Panel w/o Chol/HDL Ratio  Result Value Ref Range   Cholesterol, Total 185 100 - 199 mg/dL   Triglycerides 44 0 - 149 mg/dL   HDL 72 >39 mg/dL   VLDL Cholesterol Cal 9 5 - 40 mg/dL   LDL Calculated 104 (H) 0 - 99 mg/dL  TSH  Result Value Ref Range   TSH 2.050 0.450 - 4.500 uIU/mL  UA/M w/rflx Culture, Routine  Result Value Ref Range   Specific Gravity, UA 1.015 1.005 - 1.030   pH, UA 8.5 (H) 5.0 - 7.5   Color, UA Yellow Yellow   Appearance Ur Turbid (A) Clear   Leukocytes, UA Trace (A) Negative   Protein, UA 1+ (A) Negative/Trace   Glucose, UA Negative Negative   Ketones, UA Negative Negative   RBC, UA Negative Negative   Bilirubin, UA Negative Negative   Urobilinogen, Ur 0.2 0.2 - 1.0 mg/dL   Nitrite, UA Negative Negative   Microscopic Examination See below:   Pap IG and HPV (high risk) DNA detection  Result Value Ref Range   DIAGNOSIS: Comment    Specimen adequacy: Comment    Clinician Provided ICD10 Comment    Performed by: Comment    QC reviewed by: Comment    PAP Smear Comment .    Note: Comment    Test Methodology Comment    HPV, high-risk Negative Negative      Assessment & Plan:   Problem List Items Addressed This Visit      Other   Adult ADHD (attention deficit hyperactivity disorder) - Primary (Chronic)    Did better on 20 mg adderall XR than the short release, wanting to switch back. Will send 20 mg XR and monitor for benefit       Other Visit Diagnoses    Encounter for surveillance of injectable contraceptive       Urine pregnancy negative, depo given today. Pt aware to come every 3 months for injections   Relevant Medications   medroxyPROGESTERone (DEPO-PROVERA) injection 150 mg (Completed)   Other Relevant Orders   Pregnancy, urine   BV (bacterial vaginosis)       Persistent sxs despite course of  oral flagyl. Will start vaginal cream and probiotics and monitor for relief. F/u if no better       Follow up plan: Return in about 3 months (around 11/04/2017) for ADHD f/u.

## 2017-08-06 NOTE — Assessment & Plan Note (Signed)
Did better on 20 mg adderall XR than the short release, wanting to switch back. Will send 20 mg XR and monitor for benefit

## 2017-08-25 ENCOUNTER — Encounter: Payer: Self-pay | Admitting: Family Medicine

## 2017-08-25 ENCOUNTER — Ambulatory Visit (INDEPENDENT_AMBULATORY_CARE_PROVIDER_SITE_OTHER): Payer: No Typology Code available for payment source | Admitting: Family Medicine

## 2017-08-25 VITALS — BP 108/73 | HR 85 | Temp 98.5°F | Wt 147.5 lb

## 2017-08-25 DIAGNOSIS — J101 Influenza due to other identified influenza virus with other respiratory manifestations: Secondary | ICD-10-CM | POA: Diagnosis not present

## 2017-08-25 DIAGNOSIS — J029 Acute pharyngitis, unspecified: Secondary | ICD-10-CM | POA: Diagnosis not present

## 2017-08-25 LAB — VERITOR FLU A/B WAIVED
Influenza A: POSITIVE — AB
Influenza B: NEGATIVE

## 2017-08-25 MED ORDER — BALOXAVIR MARBOXIL(40 MG DOSE) 2 X 20 MG PO TBPK: 40.0000 mg | ORAL_TABLET | Freq: Once | ORAL | 0 refills | Status: AC

## 2017-08-25 NOTE — Progress Notes (Signed)
BP 108/73 (BP Location: Right Arm, Patient Position: Sitting, Cuff Size: Normal)   Pulse 85   Temp 98.5 F (36.9 C) (Oral)   Wt 147 lb 8 oz (66.9 kg)   SpO2 100%   BMI 24.93 kg/[place]ant[place]    Subjective:    Patient ID: Amber Santana Mitro, female    DOB: 12/05/1980, 37 y.o.   MRN: [id number]  HPI: Amber Santana Pascal is a 37 y.o. female  Chief Complaint  Patient presents with  . Cough    Started Saturday. Symptoms worsened significantly yesterday-today. Exposure to flu and strep.  Marland Kitchen. Headache  . Sore Throat  . Chills   Patient here with headache, sore throat, chills, sweats, fevers, fatigue x 2-3 days. Denies CP, SOB, wheezing, N/V. Has been taking OTC cold and flu medications with minimal relief. Lots of sick contacts at work with strep and flu. UTD on flu vaccine.   Relevant past medical, surgical, family and social history reviewed and updated as indicated. Interim medical history since our last visit reviewed. Allergies and medications reviewed and updated.  Review of Systems  Per HPI unless specifically indicated above     Objective:    BP 108/73 (BP Location: Right Arm, Patient Position: Sitting, Cuff Size: Normal)   Pulse 85   Temp 98.5 F (36.9 C) (Oral)   Wt 147 lb 8 oz (66.9 kg)   SpO2 100%   BMI 24.93 kg/Santana   Wt Readings from Last 3 Encounters:  08/25/17 147 lb 8 oz (66.9 kg)  08/06/17 148 lb 1.6 oz (67.2 kg)  05/05/17 147 lb 11.2 oz (67 kg)    Physical Exam  Constitutional: She is oriented to person, place, and time. She appears well-developed and well-nourished. She appears distressed (appears ill).  HENT:  Head: Atraumatic.  Eyes: Conjunctivae are normal. Pupils are equal, round, and reactive to light. No scleral icterus.  Neck: Normal range of motion. Neck supple.  Cardiovascular: Normal rate and normal heart sounds.  Pulmonary/Chest: Effort normal and breath sounds normal. No respiratory distress.  Musculoskeletal: Normal range of motion.  Lymphadenopathy:   She has no cervical adenopathy.  Neurological: She is alert and oriented to person, place, and time.  Skin: Skin is warm. She is diaphoretic.  Psychiatric: She has a normal mood and affect. Her behavior is normal.  Nursing note and vitals reviewed.  Results for orders placed or performed in visit on 08/25/17  Rapid Strep Screen ([facility])  Result Value Ref Range   Strep Gp A Ag, IA W/Reflex Negative Negative  Culture, Group A Strep  Result Value Ref Range   Strep A Culture Comment   Influenza A & B (STAT)  Result Value Ref Range   Influenza A Positive (A) Negative   Influenza B Negative Negative      Assessment & Plan:   Problem List Items Addressed This Visit    None    Visit Diagnoses    Influenza A    -  Primary   Will tx with xofluza, continue OTC remedies and fever reducers. Rest, push fluids. F/u if worsening or no improvement   Relevant Orders   Influenza A & B (STAT) (Completed)   Sore throat       Rapid strep neg, await cx. Salt water gargles, chlorasceptic spray prn.    Relevant Orders   Rapid [facility] Screen ([facility]) (Completed)       Follow up plan: Return for as scheduled.

## 2017-08-27 ENCOUNTER — Other Ambulatory Visit: Payer: Self-pay | Admitting: Family Medicine

## 2017-08-27 LAB — RAPID STREP SCREEN (MED CTR MEBANE ONLY): Strep Gp A Ag, IA W/Reflex: NEGATIVE

## 2017-08-27 LAB — CULTURE, GROUP A STREP

## 2017-08-27 MED ORDER — AMOXICILLIN 875 MG PO TABS
875.0000 mg | ORAL_TABLET | Freq: Two times a day (BID) | ORAL | 0 refills | Status: DC
Start: 2017-08-27 — End: 2017-11-12

## 2017-08-27 NOTE — Patient Instructions (Signed)
Follow up as scheduled.  

## 2017-10-21 ENCOUNTER — Other Ambulatory Visit: Payer: Self-pay | Admitting: Family Medicine

## 2017-10-21 ENCOUNTER — Encounter: Payer: Self-pay | Admitting: Family Medicine

## 2017-10-21 MED ORDER — LORATADINE 10 MG PO TABS: 10.0000 mg | ORAL_TABLET | Freq: Every day | ORAL | 11 refills | Status: AC

## 2017-11-05 ENCOUNTER — Ambulatory Visit: Payer: No Typology Code available for payment source | Admitting: Family Medicine

## 2017-11-12 ENCOUNTER — Encounter: Payer: Self-pay | Admitting: Family Medicine

## 2017-11-12 ENCOUNTER — Ambulatory Visit (INDEPENDENT_AMBULATORY_CARE_PROVIDER_SITE_OTHER): Payer: No Typology Code available for payment source | Admitting: Family Medicine

## 2017-11-12 VITALS — BP 137/88 | HR 82 | Temp 98.5°F | Ht 64.5 in | Wt 154.1 lb

## 2017-11-12 DIAGNOSIS — F321 Major depressive disorder, single episode, moderate: Secondary | ICD-10-CM

## 2017-11-12 DIAGNOSIS — F909 Attention-deficit hyperactivity disorder, unspecified type: Secondary | ICD-10-CM | POA: Diagnosis not present

## 2017-11-12 DIAGNOSIS — Z3042 Encounter for surveillance of injectable contraceptive: Secondary | ICD-10-CM

## 2017-11-12 LAB — PREGNANCY, URINE: Preg Test, Ur: NEGATIVE

## 2017-11-12 MED ORDER — AMPHETAMINE-DEXTROAMPHET ER 20 MG PO CP24: 20.0000 mg | ORAL_CAPSULE | Freq: Every day | ORAL | 0 refills | Status: DC

## 2017-11-12 MED ORDER — MEDROXYPROGESTERONE ACETATE 150 MG/ML IM SUSP
150.0000 mg | Freq: Once | INTRAMUSCULAR | Status: AC
  Administered 2017-11-12: 150 mg via INTRAMUSCULAR

## 2017-11-12 MED ORDER — VENLAFAXINE HCL ER 75 MG PO CP24: 75.0000 mg | ORAL_CAPSULE | Freq: Every day | ORAL | 1 refills | Status: DC

## 2017-11-12 NOTE — Progress Notes (Signed)
BP 137/88   Pulse 82   Temp 98.5 F (36.9 C) (Oral)   Ht 5' 4.5" (1.638 m)   Wt 154 lb 1.6 oz (69.9 kg)   SpO2 99%   BMI 26.04 kg/m    Subjective:    Patient ID: Amber Santana, female    DOB: [birth year], 37 y.o.   MRN: [id number]  HPI: Amber Santana is a 37 y.o. female  Chief Complaint  Patient presents with  . ADHD   Pt here today for ADHD f/u. Continues to do well on current adderall XR dose of 20 mg. No side effects, appetite still good and sleeping adequately. Feels it's significantly benefiting her in improving focus and task completion at work.   Abnormal bleeding since starting back on the depo after a 6 month break. Intermittent, unpredictable bleeding but no major cramping or other issues. Is late for her shot today, does not believe there's a chance of pregnancy.   Having some mood issues currently due to significant personal life and work stress. Denies SI/HI, but just feels very moody and down most of the time lately.   Past Medical History:  Diagnosis Date  . ADHD (attention deficit hyperactivity disorder)   . Arthritis    lower back  . Lumbago   . Migraines    1x/mo  . Neck stiffness    limited side to side mvmt.  OK up and down.  . Neuropathy    Social History   Socioeconomic History  . Marital status: Married    Spouse name: Not on file  . Number of children: Not on file  . Years of education: Not on file  . Highest education level: Not on file  Occupational History  . Not on file  Social Needs  . Financial resource strain: Not on file  . Food insecurity:    Worry: Not on file    Inability: Not on file  . Transportation needs:    Medical: Not on file    Non-medical: Not on file  Tobacco Use  . Smoking status: Former Smoker    Packs/day: 1.00    Years: 10.00    Pack years: 10.00    Types: Cigarettes    Last attempt to quit: 07/07/2008    Years since quitting: 9.3  . Smokeless tobacco: Never Used  Substance and Sexual Activity  .  Alcohol use: Yes    Alcohol/week: 3.0 oz    Types: 5 Glasses of wine per week  . Drug use: No  . Sexual activity: Not on file  Lifestyle  . Physical activity:    Days per week: Not on file    Minutes per session: Not on file  . Stress: Not on file  Relationships  . Social connections:    Talks on phone: Not on file    Gets together: Not on file    Attends religious service: Not on file    Active member of club or organization: Not on file    Attends meetings of clubs or organizations: Not on file    Relationship status: Not on file  . Intimate partner violence:    Fear of current or ex partner: Not on file    Emotionally abused: Not on file    Physically abused: Not on file    Forced sexual activity: Not on file  Other Topics Concern  . Not on file  Social History Narrative  . Not on file    Relevant past medical,  surgical, family and social history reviewed and updated as indicated. Interim medical history since our last visit reviewed. Allergies and medications reviewed and updated.  Review of Systems  Per HPI unless specifically indicated above     Objective:    BP 137/88   Pulse 82   Temp 98.5 F (36.9 C) (Oral)   Ht 5' 4.5" (1.638 m)   Wt 154 lb 1.6 oz (69.9 kg)   SpO2 99%   BMI 26.04 kg/m   Wt Readings from Last 3 Encounters:  11/12/17 154 lb 1.6 oz (69.9 kg)  08/25/17 147 lb 8 oz (66.9 kg)  08/06/17 148 lb 1.6 oz (67.2 kg)    Physical Exam  Constitutional: She is oriented to person, place, and time. She appears well-developed and well-nourished.  HENT:  Head: Atraumatic.  Eyes: Pupils are equal, round, and reactive to light. Conjunctivae and EOM are normal.  Neck: Normal range of motion. Neck supple.  Cardiovascular: Normal rate and regular rhythm.  Pulmonary/Chest: Effort normal and breath sounds normal. No respiratory distress.  Musculoskeletal: Normal range of motion.  Neurological: She is alert and oriented to person, place, and time.  Skin:  Skin is warm and dry.  Psychiatric: She has a normal mood and affect. Her behavior is normal.  Nursing note and vitals reviewed.   Results for orders placed or performed in visit on 11/12/17  Pregnancy, urine  Result Value Ref Range   Preg Test, Ur Negative Negative      Assessment & Plan:   Problem List Items Addressed This Visit      Other   Adult ADHD (attention deficit hyperactivity disorder) (Chronic)    Stable on current adderall dose. Continue current regimen. One month printed today, will e-scribe the next 2 months prior to follow up      Depression, major, single episode, moderate (HCC)    Long discussion with pt about options. She is open to trying effexor. Risks and benefits reviewed. Discussed free counseling through work, she will consider. F/u in 1 month      Relevant Medications   venlafaxine XR (EFFEXOR XR) 75 MG 24 hr capsule    Other Visit Diagnoses    Encounter for surveillance of injectable contraceptive    -  Primary   Urine pregnancy negative, will adminiser depo today. Reassured pt that irreg bleeding could be normal adjustment, she will keep [place] informed over the next 3 mo.   Relevant Medications   medroxyPROGESTERone (DEPO-PROVERA) injection 150 mg (Completed)   Other Relevant Orders   Pregnancy, urine (Completed)       Follow up plan: Return in about 3 months (around 02/12/2018) for ADHD.

## 2017-11-14 DIAGNOSIS — F321 Major depressive disorder, single episode, moderate: Secondary | ICD-10-CM | POA: Insufficient documentation

## 2017-11-14 NOTE — Assessment & Plan Note (Signed)
Stable on current adderall dose. Continue current regimen. One month printed today, will e-scribe the next 2 months prior to follow up

## 2017-11-14 NOTE — Assessment & Plan Note (Signed)
Long discussion with pt about options. She is open to trying effexor. Risks and benefits reviewed. Discussed free counseling through work, she will consider. F/u in 1 month

## 2017-11-14 NOTE — Patient Instructions (Signed)
Follow up in 1 month   

## 2017-12-08 ENCOUNTER — Encounter: Payer: Self-pay | Admitting: Family Medicine

## 2017-12-08 MED ORDER — SILVER SULFADIAZINE 1 % EX CREA: 1.0000 "application " | TOPICAL_CREAM | Freq: Every day | CUTANEOUS | 0 refills | Status: DC

## 2017-12-22 ENCOUNTER — Encounter: Payer: Self-pay | Admitting: Family Medicine

## 2017-12-23 ENCOUNTER — Other Ambulatory Visit: Payer: Self-pay | Admitting: Family Medicine

## 2017-12-23 MED ORDER — PREDNISONE 10 MG PO TABS: ORAL_TABLET | ORAL | 0 refills | Status: DC

## 2017-12-29 ENCOUNTER — Other Ambulatory Visit: Payer: Self-pay | Admitting: Family Medicine

## 2017-12-30 MED ORDER — AMPHETAMINE-DEXTROAMPHET ER 20 MG PO CP24: 20.0000 mg | ORAL_CAPSULE | Freq: Every day | ORAL | 0 refills | Status: DC

## 2017-12-31 ENCOUNTER — Encounter: Payer: Self-pay | Admitting: Family Medicine

## 2018-01-03 MED ORDER — AMPHETAMINE-DEXTROAMPHET ER 20 MG PO CP24: 20.0000 mg | ORAL_CAPSULE | Freq: Every day | ORAL | 0 refills | Status: DC

## 2018-01-20 ENCOUNTER — Ambulatory Visit (INDEPENDENT_AMBULATORY_CARE_PROVIDER_SITE_OTHER): Payer: Self-pay | Admitting: Medical

## 2018-01-20 ENCOUNTER — Encounter: Payer: Self-pay | Admitting: Medical

## 2018-01-20 VITALS — BP 142/100 | HR 102 | Temp 99.1°F | Wt 155.0 lb

## 2018-01-20 DIAGNOSIS — R300 Dysuria: Secondary | ICD-10-CM

## 2018-01-20 DIAGNOSIS — N3001 Acute cystitis with hematuria: Secondary | ICD-10-CM

## 2018-01-20 LAB — POCT URINALYSIS DIPSTICK
Bilirubin, UA: NEGATIVE
Glucose, UA: NEGATIVE
Ketones, UA: NEGATIVE
NITRITE UA: NEGATIVE
PH UA: 6 (ref 5.0–8.0)
PROTEIN UA: NEGATIVE
Urobilinogen, UA: 0.2 E.U./dL

## 2018-01-20 MED ORDER — CIPROFLOXACIN HCL 500 MG PO TABS: ORAL_TABLET | ORAL | 0 refills | Status: DC

## 2018-01-20 NOTE — Progress Notes (Addendum)
   Subjective:    Patient ID: Amber Santana, female    DOB: 09/23/1980, 37 y.o.   MRN: [id number]  HPI 37 yo female in non acute distress.  Presents today with complaints of feeling she had to urinate but could not , and pressure while urinating afterwards feeling like she has pain after going, no burning , noticed blood with wiping today. Symptoms started  5 days ago . Increased fluids all week.  Has had previous infections with similar symptoms. Last infection was 3 years ago.  Uses a womens probiotic at the [employer].  Blood pressure (!) 142/100, pulse (!) 102, temperature 99.1 F (37.3 C), weight 155 lb (70.3 kg), SpO2 99 %.   Was running and did stairs over here to the clinic ,"  [facility]".  Review of Systems  Constitutional: Negative for chills, fatigue and fever.  HENT: Negative for congestion and ear discharge.   Eyes: Negative for discharge, itching and visual disturbance.  Respiratory: Negative for cough and shortness of breath.   Cardiovascular: Negative for chest pain, palpitations and leg swelling.  Endocrine: Negative for polydipsia, polyphagia and polyuria.  Genitourinary: Positive for decreased urine volume, difficulty urinating, dysuria, frequency, urgency and vaginal pain (a little today). Negative for dyspareunia, flank pain, hematuria, pelvic pain, vaginal bleeding and vaginal discharge.  Neurological: Positive for dizziness (getting on exam "just now"). Negative for syncope, light-headedness and headaches.       Objective:   Physical Exam  Constitutional: She is oriented to person, place, and time. She appears well-developed.  HENT:  Head: Normocephalic and atraumatic.  Eyes: Pupils are equal, round, and reactive to light. Conjunctivae and EOM are normal.  Neck: Normal range of motion.  Cardiovascular: Normal rate, regular rhythm, normal heart sounds and intact distal pulses.  Pulmonary/Chest: Effort normal  and breath sounds normal.  Abdominal: Soft. She exhibits no distension and no mass. There is no tenderness. There is no rebound and no guarding. No hernia.  Musculoskeletal: Normal range of motion.  Neurological: She is alert and oriented to person, place, and time. No cranial nerve deficit. Coordination normal.  Skin: Skin is warm and dry.  Psychiatric: She has a normal mood and affect. Her behavior is normal. Judgment and thought content normal.  Nursing note and vitals reviewed.   No CVA tenderness     urine  Dip  1+ leuks and 2+ blood  Had patient walk in room and sit back up on the table ,  And get back up on exam table. No dizziness. 5/5 grip bilateral, /5/5 strength lower legs bilateral. Tongue midline , shrugs shoulders equally Gait wnl and steady   140/100  At discharge 130/90  Assessment & Plan:  Urinary tract Infection Elevated blood pressure Reviewed hygiene with patient Return to clinic in 3 days if not improving. Continue to drink plenty of water. Meds ordered this encounter  Medications  . ciprofloxacin (CIPRO) 500 MG tablet    Sig: Take one tablet twice daily x 7 days.    Dispense:  14 tablet    Refill:  0  by mouth ( pharmacy called). Follow up with your doctor within the week for a blood pressure recheck.  Patient verbalizes understanding and has no questions at discharge. Called patient, left message for her to return my call. Patient left without discharge paperwork.  Also patient wanted Diflucan pill per CMA Dois DavenportSandra.

## 2018-01-20 NOTE — Patient Instructions (Signed)
Return to clinic in 3 days if not improving. Continue to drink plenty of water.    Urinary Tract Infection, Adult A urinary tract infection (UTI) is an infection of any part of the urinary tract. The urinary tract includes the:  Kidneys.  Ureters.  Bladder.  Urethra.  These organs make, store, and get rid of pee (urine) in the body. Follow these instructions at home:  Take over-the-counter and prescription medicines only as told by your doctor.  If you were prescribed an antibiotic medicine, take it as told by your doctor. Do not stop taking the antibiotic even if you start to feel better.  Avoid the following drinks: ? Alcohol. ? Caffeine. ? Tea. ? Carbonated drinks.  Drink enough fluid to keep your pee clear or pale yellow.  Keep all follow-up visits as told by your doctor. This is important.  Make sure to: ? Empty your bladder often and completely. Do not to hold pee for long periods of time. ? Empty your bladder before and after sex. ? Wipe from front to back after a bowel movement if you are female. Use each tissue one time when you wipe. Contact a doctor if:  You have back pain.  You have a fever.  You feel sick to your stomach (nauseous).  You throw up (vomit).  Your symptoms do not get better after 3 days.  Your symptoms go away and then come back. Get help right away if:  You have very bad back pain.  You have very bad lower belly (abdominal) pain.  You are throwing up and cannot keep down any medicines or water. This information is not intended to replace advice given to you by your health care provider. Make sure you discuss any questions you have with your health care provider. Document Released: 12/16/2007 Document Revised: 12/05/2015 Document Reviewed: 05/20/2015 Elsevier Interactive Patient Education  [employer]2018 [employer]

## 2018-01-24 ENCOUNTER — Telehealth: Payer: Self-pay | Admitting: Emergency Medicine

## 2018-01-24 NOTE — Telephone Encounter (Signed)
Spoke with patient whom informed me that she is doing much better.

## 2018-01-27 ENCOUNTER — Other Ambulatory Visit: Payer: Self-pay | Admitting: Family Medicine

## 2018-01-27 ENCOUNTER — Encounter: Payer: Self-pay | Admitting: Family Medicine

## 2018-01-27 DIAGNOSIS — Z3042 Encounter for surveillance of injectable contraceptive: Secondary | ICD-10-CM

## 2018-01-27 MED ORDER — MEDROXYPROGESTERONE ACETATE 150 MG/ML IM SUSP: 150.0000 mg | INTRAMUSCULAR | 0 refills | Status: AC

## 2018-01-28 ENCOUNTER — Ambulatory Visit (INDEPENDENT_AMBULATORY_CARE_PROVIDER_SITE_OTHER): Payer: No Typology Code available for payment source

## 2018-01-28 DIAGNOSIS — Z3042 Encounter for surveillance of injectable contraceptive: Secondary | ICD-10-CM | POA: Diagnosis not present

## 2018-01-28 MED ORDER — MEDROXYPROGESTERONE ACETATE 150 MG/ML IM SUSP
150.0000 mg | INTRAMUSCULAR | Status: AC
  Administered 2018-01-28 – 2019-09-29 (×8): 150 mg via INTRAMUSCULAR

## 2018-02-08 ENCOUNTER — Encounter: Payer: Self-pay | Admitting: Family Medicine

## 2018-02-09 ENCOUNTER — Other Ambulatory Visit: Payer: Self-pay | Admitting: Family Medicine

## 2018-02-09 DIAGNOSIS — Z872 Personal history of diseases of the skin and subcutaneous tissue: Secondary | ICD-10-CM

## 2018-02-16 NOTE — Telephone Encounter (Signed)
I just send her notes to them via manual fax.

## 2018-02-18 ENCOUNTER — Ambulatory Visit (INDEPENDENT_AMBULATORY_CARE_PROVIDER_SITE_OTHER): Payer: No Typology Code available for payment source | Admitting: Family Medicine

## 2018-02-18 ENCOUNTER — Encounter: Payer: Self-pay | Admitting: Family Medicine

## 2018-02-18 VITALS — BP 125/83 | HR 91 | Temp 98.2°F | Ht 63.5 in | Wt 159.0 lb

## 2018-02-18 DIAGNOSIS — F909 Attention-deficit hyperactivity disorder, unspecified type: Secondary | ICD-10-CM

## 2018-02-18 NOTE — Progress Notes (Signed)
   BP 125/83   Pulse 91   Temp 98.2 F (36.8 C) (Oral)   Ht 5' 3.5" (1.613 m)   Wt 159 lb (72.1 kg)   SpO2 100%   BMI 27.72 kg/m    Subjective:    Patient ID: Amber MeiersJulie M Santana, female    DOB: 11/05/1980, 37 y.o.   MRN: [id number]  HPI: Amber MeiersJulie M Santana is a 37 y.o. female  Chief Complaint  Patient presents with  . ADHD  . Medication Refill    adderall   Here today for ADHD f/u. Doing very well on adderall XR at 20 mg. Denies CP, palpitations, insomnia, appetite issues. Feels it significantly helps with her focus both at work and home.   Relevant past medical, surgical, family and social history reviewed and updated as indicated. Interim medical history since our last visit reviewed. Allergies and medications reviewed and updated.  Review of Systems  Per HPI unless specifically indicated above     Objective:    BP 125/83   Pulse 91   Temp 98.2 F (36.8 C) (Oral)   Ht 5' 3.5" (1.613 m)   Wt 159 lb (72.1 kg)   SpO2 100%   BMI 27.72 kg/m   Wt Readings from Last 3 Encounters:  02/18/18 159 lb (72.1 kg)  01/20/18 155 lb (70.3 kg)  11/12/17 154 lb 1.6 oz (69.9 kg)    Physical Exam  Constitutional: She is oriented to person, place, and time. She appears well-developed and well-nourished. No distress.  Eyes: Conjunctivae and EOM are normal.  Neck: Normal range of motion. Neck supple.  Cardiovascular: Normal rate and regular rhythm.  Pulmonary/Chest: Effort normal and breath sounds normal.  Musculoskeletal: Normal range of motion.  Neurological: She is alert and oriented to person, place, and time.  Skin: Skin is warm and dry.  Psychiatric: She has a normal mood and affect. Her behavior is normal.  Nursing note and vitals reviewed.   Results for orders placed or performed in visit on 01/20/18  POCT Urinalysis Dipstick  Result Value Ref Range   Color, UA yellow    Clarity, UA clear    Glucose, UA Negative Negative   Bilirubin, UA neg    Ketones, UA neg    Spec  Grav, UA >=1.030 (A) 1.010 - 1.025   Blood, UA 2+    pH, UA 6.0 5.0 - 8.0   Protein, UA Negative Negative   Urobilinogen, UA 0.2 0.2 or 1.0 E.U./dL   Nitrite, UA neg    Leukocytes, UA Small (1+) (A) Negative   Appearance     Odor        Assessment & Plan:   Problem List Items Addressed This Visit      Other   Adult ADHD (attention deficit hyperactivity disorder) - Primary (Chronic)    Stable, under good control. Continue current regimen          Follow up plan: Return in about 3 months (around 05/21/2018) for CPE.

## 2018-02-21 MED ORDER — AMPHETAMINE-DEXTROAMPHET ER 20 MG PO CP24: 20.0000 mg | ORAL_CAPSULE | Freq: Every day | ORAL | 0 refills | Status: DC

## 2018-02-21 NOTE — Assessment & Plan Note (Signed)
Stable, under good control. Continue current regimen 

## 2018-02-21 NOTE — Patient Instructions (Signed)
Follow up for CPE 

## 2018-04-08 ENCOUNTER — Ambulatory Visit: Payer: No Typology Code available for payment source

## 2018-04-15 ENCOUNTER — Other Ambulatory Visit: Payer: Self-pay | Admitting: Family Medicine

## 2018-04-15 NOTE — Telephone Encounter (Signed)
She has an rx that is set for 04-23-18

## 2018-04-15 NOTE — Telephone Encounter (Signed)
Patient notified

## 2018-04-15 NOTE — Telephone Encounter (Signed)
Routing to provider  

## 2018-04-22 ENCOUNTER — Encounter: Payer: Self-pay | Admitting: Family Medicine

## 2018-04-29 IMAGING — US US ABDOMEN LIMITED
1 series · 14 of 25 positions shown · non-contrast
Comparison: CT scan of March 10, 2011.

CLINICAL DATA: Right upper quadrant abdominal pain for 2 months.

EXAM:
ULTRASOUND ABDOMEN LIMITED RIGHT UPPER QUADRANT

[Series 1: us abdomen limited · 0.22mm/px · 14 of 53 slices shown]
[im 1/53]
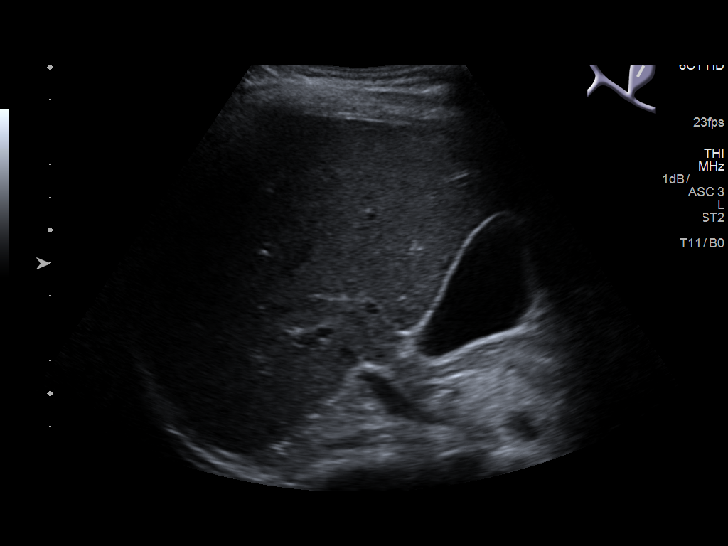
[im 5/53]
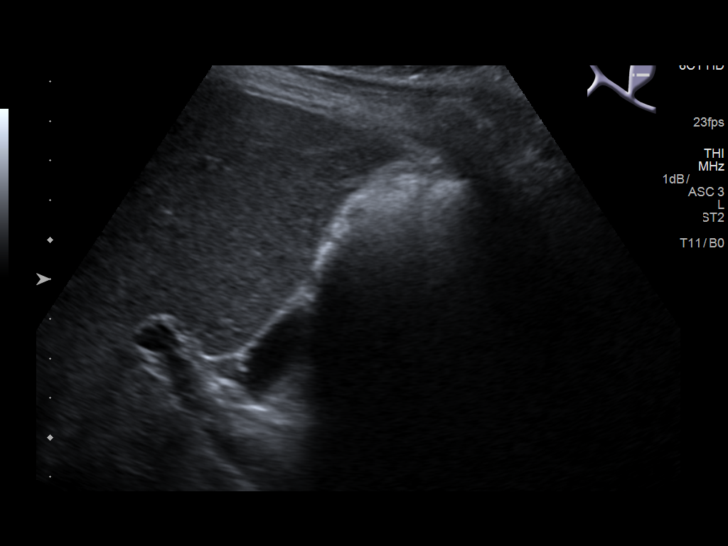
[im 9/53]
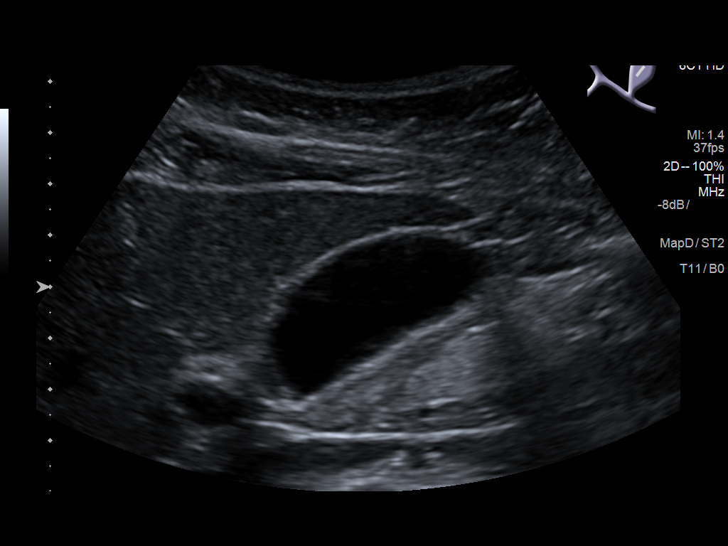
[im 14/53]
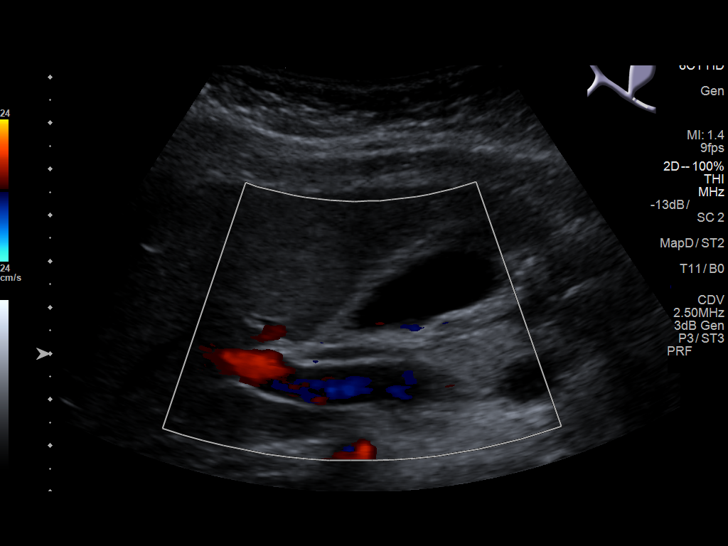
[im 18/53]
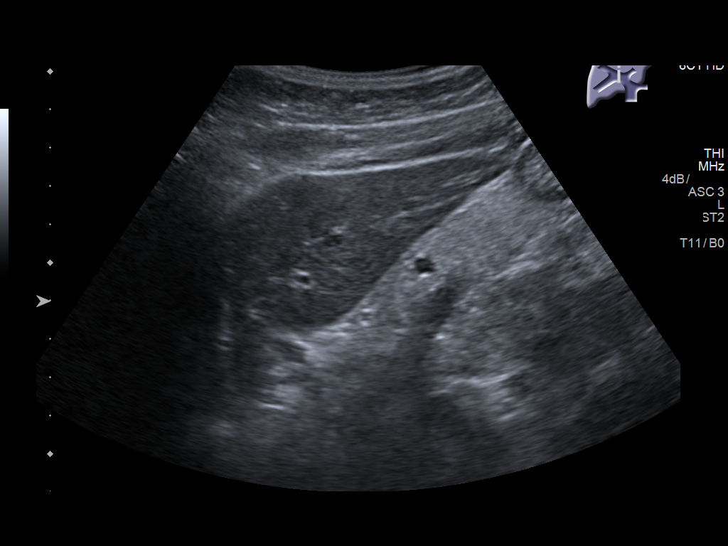
[im 20/53]
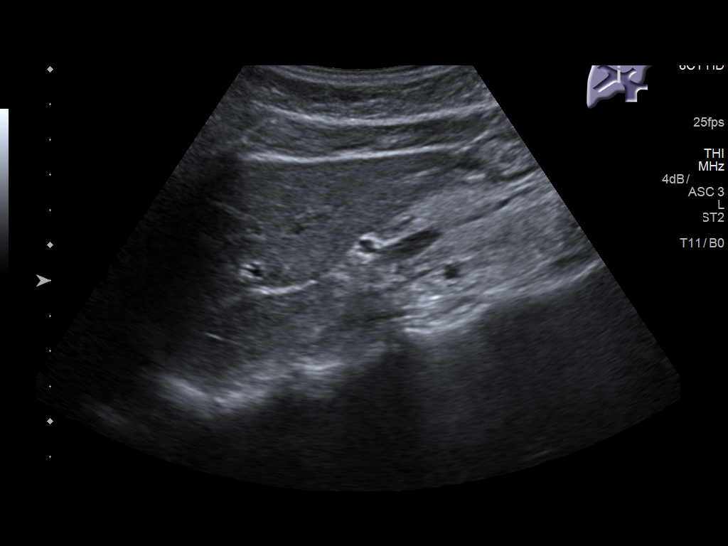
[im 24/53]
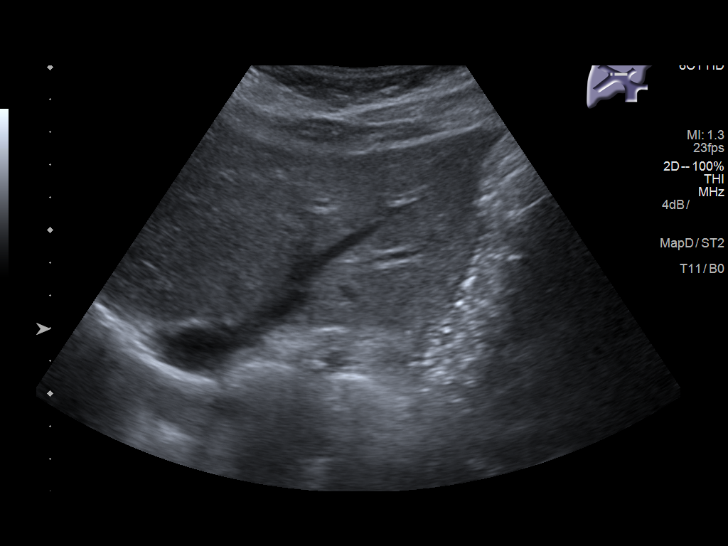
[im 29/53]
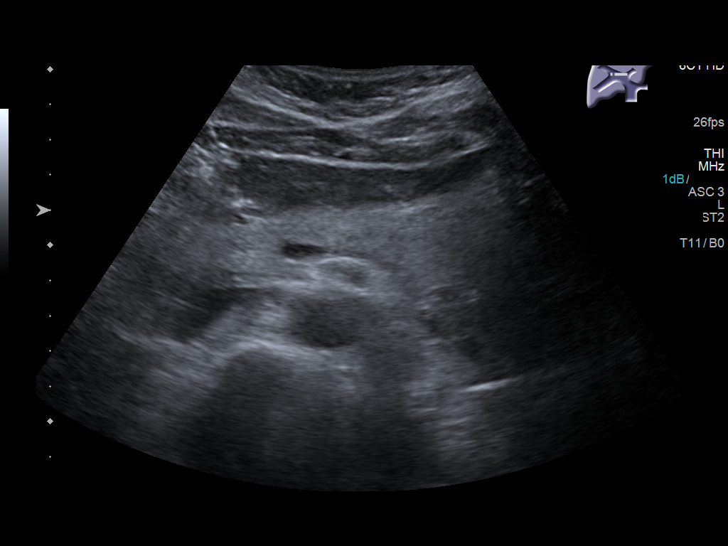
[im 33/53]
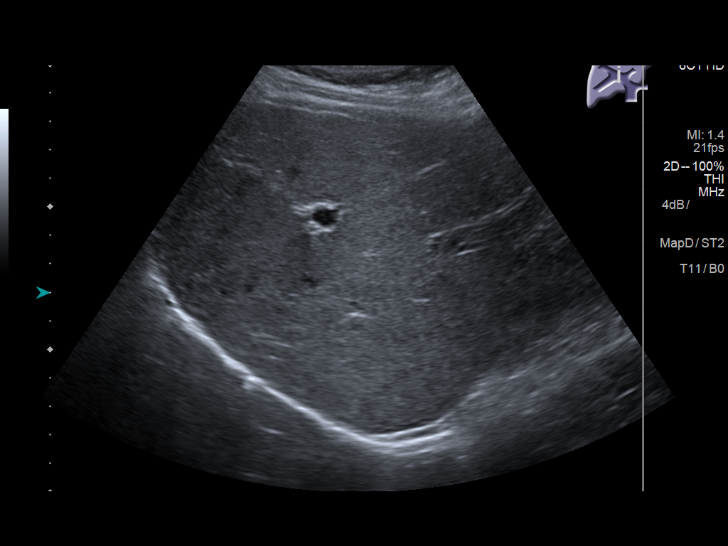
[im 35/53]
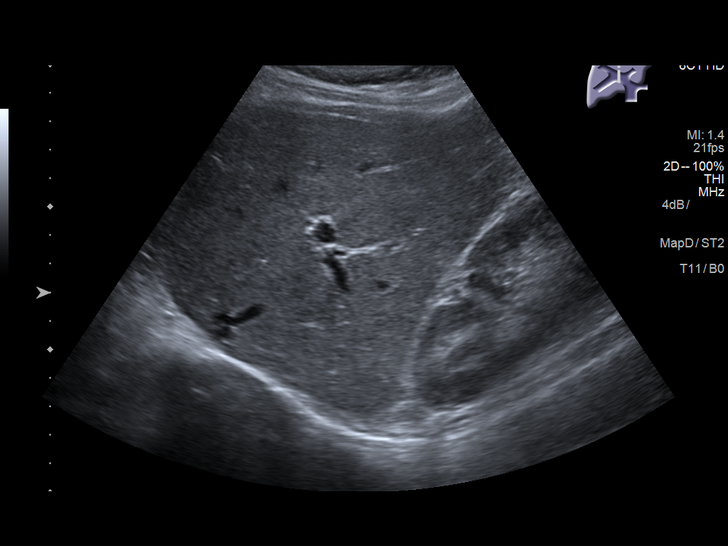
[im 40/53]
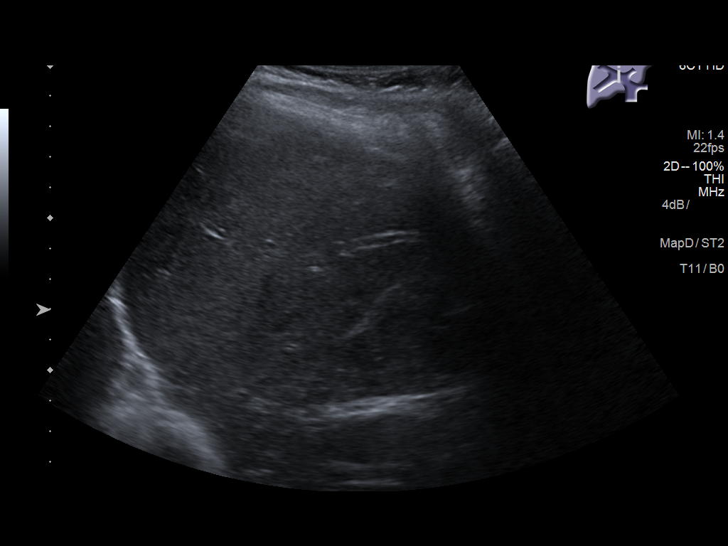
[im 44/53]
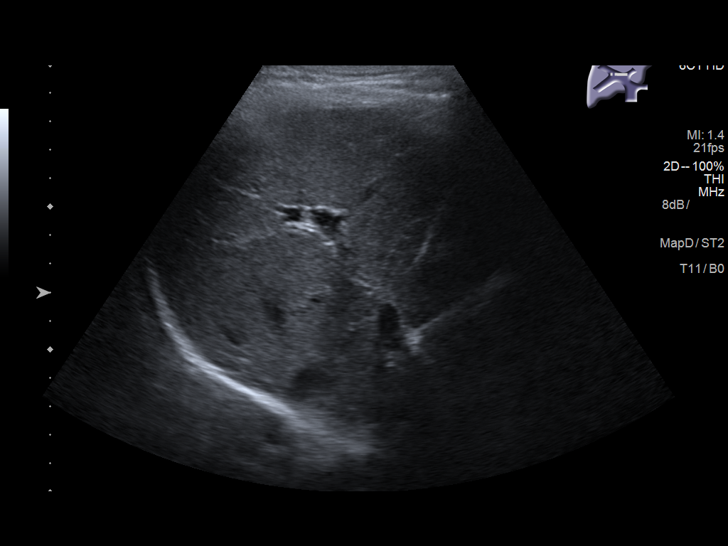
[im 48/53]
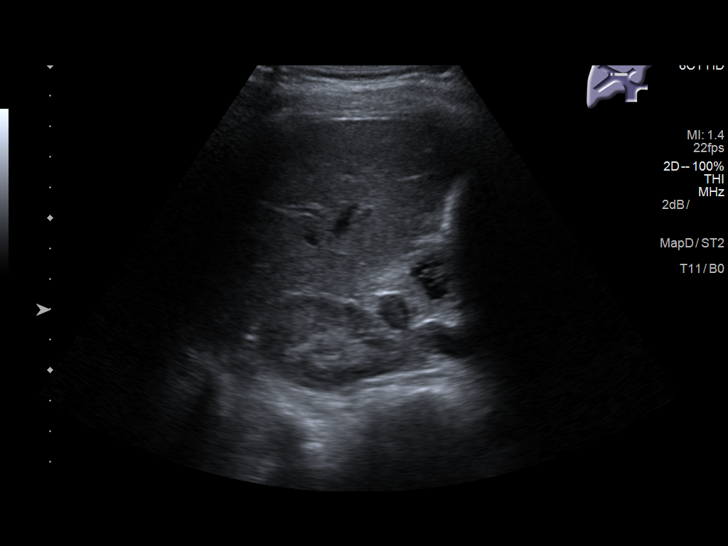
[im 53/53]
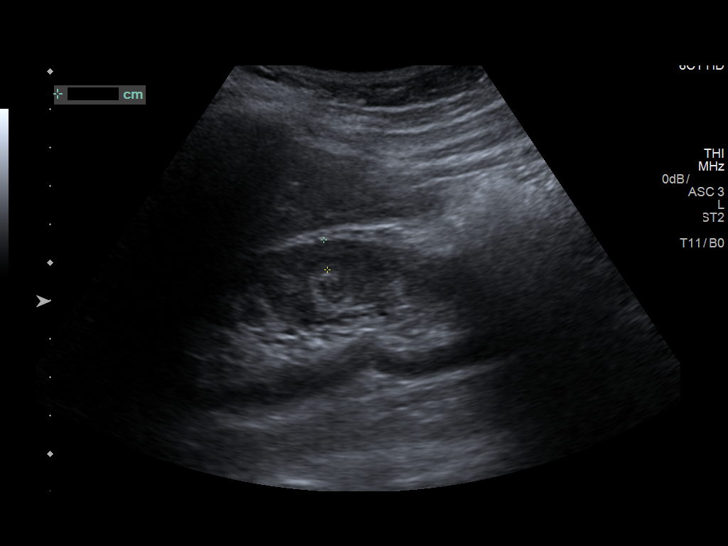

[14 of 25 positions shown; findings below may reference images not displayed]

FINDINGS: Gallbladder:

No gallstones or wall thickening visualized. No sonographic Murphy
sign noted by sonographer.

Common bile duct:

Diameter: 2.8 mm which is within normal limits.

Liver:

No focal lesion identified. Within normal limits in parenchymal
echogenicity.
IMPRESSION: No abnormality seen in the right upper quadrant of the abdomen.

## 2018-05-09 ENCOUNTER — Encounter: Payer: Self-pay | Admitting: Family Medicine

## 2018-05-09 ENCOUNTER — Ambulatory Visit (INDEPENDENT_AMBULATORY_CARE_PROVIDER_SITE_OTHER): Payer: No Typology Code available for payment source | Admitting: Family Medicine

## 2018-05-09 ENCOUNTER — Other Ambulatory Visit: Payer: Self-pay | Admitting: Family Medicine

## 2018-05-09 VITALS — BP 129/83 | HR 82 | Temp 98.9°F | Ht 64.5 in | Wt 164.5 lb

## 2018-05-09 DIAGNOSIS — F909 Attention-deficit hyperactivity disorder, unspecified type: Secondary | ICD-10-CM

## 2018-05-09 DIAGNOSIS — R232 Flushing: Secondary | ICD-10-CM | POA: Diagnosis not present

## 2018-05-09 DIAGNOSIS — Z23 Encounter for immunization: Secondary | ICD-10-CM | POA: Diagnosis not present

## 2018-05-09 DIAGNOSIS — Z0000 Encounter for general adult medical examination without abnormal findings: Secondary | ICD-10-CM

## 2018-05-09 DIAGNOSIS — L811 Chloasma: Secondary | ICD-10-CM | POA: Diagnosis not present

## 2018-05-09 DIAGNOSIS — Z3042 Encounter for surveillance of injectable contraceptive: Secondary | ICD-10-CM | POA: Diagnosis not present

## 2018-05-09 LAB — UA/M W/RFLX CULTURE, ROUTINE
Bilirubin, UA: NEGATIVE
Glucose, UA: NEGATIVE
KETONES UA: NEGATIVE
NITRITE UA: NEGATIVE
Protein, UA: NEGATIVE
SPEC GRAV UA: 1.025 (ref 1.005–1.030)
Urobilinogen, Ur: 0.2 mg/dL (ref 0.2–1.0)
pH, UA: 6 (ref 5.0–7.5)

## 2018-05-09 LAB — MICROSCOPIC EXAMINATION: Bacteria, UA: NONE SEEN

## 2018-05-09 LAB — PREGNANCY, URINE: Preg Test, Ur: NEGATIVE

## 2018-05-09 NOTE — Progress Notes (Signed)
BP 129/83 (BP Location: Left Arm, Patient Position: Sitting, Cuff Size: Normal)   Pulse 82   Temp 98.9 F (37.2 C)   Ht 5' 4.5" (1.638 m)   Wt 164 lb 8 oz (74.6 kg)   SpO2 98%   BMI 27.80 kg/m    Subjective:    Patient ID: Amber Santana, female    DOB: 27-Jan-1981, 37 y.o.   MRN: [id number]  HPI: Amber Santana is a 37 y.o. female presenting on 05/09/2018 for comprehensive medical examination. Current medical complaints include:see below  Doing well on adderall XR 20 mg daily for her ADHD. Denies side effects, feels it helps significantly with staying on task and completing what she needs to complete. No CP, palpitations, sleep or appetite issues.   Still struggling with hot flashes and sweats. Has been given SNRIs in the past to target both moods and vasomotor sxs but never took them. Not interested in starting medications. Currently on depo injections for contraception. Is on gabapentin for her chronic radiculopathy in both upper and lower back which may help some.   Being treated by Dermatology with skin lightening creams for melasma of face currently. Noticing some improvement.   She currently lives with: Menopausal Symptoms: yes  Depression Screen done today and results listed below:  Depression screen [facility] 2/9 02/18/2018 08/06/2017  Decreased Interest 0 0  Down, Depressed, Hopeless 0 0  PHQ - 2 Score 0 0  Altered sleeping 0 -  Tired, decreased energy 0 -  Change in appetite 0 -  Feeling bad or failure about yourself  0 -  Trouble concentrating 0 -  Moving slowly or fidgety/restless 0 -  Suicidal thoughts 0 -  PHQ-9 Score 0 -    The patient does not have a history of falls. I did not complete a risk assessment for falls. A plan of care for falls was not documented.   Past Medical History:  Past Medical History:  Diagnosis Date  . ADHD (attention deficit hyperactivity disorder)   . Arthritis    lower back  . Lumbago   . Migraines    1x/mo  . Neck stiffness      limited side to side mvmt.  OK up and down.  . Neuropathy     Surgical History:  Past Surgical History:  Procedure Laterality Date  . CERVICAL FUSION  07/2010   [facility], Dr Gerrit Heck  . CYSTOSCOPY    . ESOPHAGOGASTRODUODENOSCOPY (EGD) WITH PROPOFOL N/A 01/25/2017   Procedure: ESOPHAGOGASTRODUODENOSCOPY (EGD) WITH PROPOFOL;  Surgeon: [name], MD;  Location: [facility];  Service: Endoscopy;  Laterality: N/A;    Medications:  Current Outpatient Medications on File Prior to Visit  Medication Sig  . amphetamine-dextroamphetamine (ADDERALL XR) 20 MG 24 hr capsule Take 1 capsule (20 mg total) by mouth daily.  Marland Kitchen amphetamine-dextroamphetamine (ADDERALL XR) 20 MG 24 hr capsule Take 1 capsule (20 mg total) by mouth daily.  . cyclobenzaprine (FLEXERIL) 10 MG tablet Take 1 tablet (10 mg total) by mouth 3 (three) times daily as needed for muscle spasms.  Marland Kitchen gabapentin (NEURONTIN) 300 MG capsule Take 1 capsule (300 mg total) by mouth 3 (three) times daily.  Marland Kitchen loratadine (CLARITIN) 10 MG tablet Take 1 tablet (10 mg total) by mouth daily.  . medroxyPROGESTERone (DEPO-PROVERA) 150 MG/ML injection Inject 1 mL (150 mg total) into the muscle every 3 (three) months for 4 doses.   Current Facility-Administered Medications on File Prior to Visit  Medication  . medroxyPROGESTERone (  DEPO-PROVERA) injection 150 mg    Allergies:  Allergies  Allergen Reactions  . Banana     Stomach pain    Social History:  Social History   Socioeconomic History  . Marital status: Married    Spouse name: Not on file  . Number of children: Not on file  . Years of education: Not on file  . Highest education level: Not on file  Occupational History  . Not on file  Social Needs  . Financial resource strain: Not on file  . Food insecurity:    Worry: Not on file    Inability: Not on file  . Transportation needs:    Medical: Not on file    Non-medical: Not on file  Tobacco Use  . Smoking status: Former  Smoker    Packs/day: 1.00    Years: 10.00    Pack years: 10.00    Types: Cigarettes    Last attempt to quit: 07/07/2008    Years since quitting: 9.8  . Smokeless tobacco: Never Used  Substance and Sexual Activity  . Alcohol use: Yes    Alcohol/week: 5.0 standard drinks    Types: 5 Glasses of wine per week  . Drug use: No  . Sexual activity: Not on file  Lifestyle  . Physical activity:    Days per week: Not on file    Minutes per session: Not on file  . Stress: Not on file  Relationships  . Social connections:    Talks on phone: Not on file    Gets together: Not on file    Attends religious service: Not on file    Active member of club or organization: Not on file    Attends meetings of clubs or organizations: Not on file    Relationship status: Not on file  . Intimate partner violence:    Fear of current or ex partner: Not on file    Emotionally abused: Not on file    Physically abused: Not on file    Forced sexual activity: Not on file  Other Topics Concern  . Not on file  Social History Narrative  . Not on file   Social History   Tobacco Use  Smoking Status Former Smoker  . Packs/day: 1.00  . Years: 10.00  . Pack years: 10.00  . Types: Cigarettes  . Last attempt to quit: 07/07/2008  . Years since quitting: 9.8  Smokeless Tobacco Never Used   Social History   Substance and Sexual Activity  Alcohol Use Yes  . Alcohol/week: 5.0 standard drinks  . Types: 5 Glasses of wine per week    Family History:  Family History  Problem Relation Age of Onset  . Asthma Brother   . Anemia Brother   . Diabetes Maternal Grandfather   . Heart disease Maternal Grandfather   . Cancer Neg Hx   . COPD Neg Hx   . Stroke Neg Hx     Past medical history, surgical history, medications, allergies, family history and social history reviewed with patient today and changes made to appropriate areas of the chart.   Review of Systems - General ROS: positive for  - hot flashes and  night sweats Psychological ROS: positive for - concentration difficulties Ophthalmic ROS: negative ENT ROS: negative Allergy and Immunology ROS: negative Hematological and Lymphatic ROS: negative Endocrine ROS: negative Breast ROS: negative for breast lumps Respiratory ROS: no cough, shortness of breath, or wheezing Cardiovascular ROS: no chest pain or dyspnea on exertion Gastrointestinal  ROS: no abdominal pain, change in bowel habits, or black or bloody stools Genito-Urinary ROS: no dysuria, trouble voiding, or hematuria Musculoskeletal ROS: negative Neurological ROS: no TIA or stroke symptoms Dermatological ROS: negative All other ROS negative except what is listed above and in the HPI.      Objective:    BP 129/83 (BP Location: Left Arm, Patient Position: Sitting, Cuff Size: Normal)   Pulse 82   Temp 98.9 F (37.2 C)   Ht 5' 4.5" (1.638 m)   Wt 164 lb 8 oz (74.6 kg)   SpO2 98%   BMI 27.80 kg/m   Wt Readings from Last 3 Encounters:  05/09/18 164 lb 8 oz (74.6 kg)  02/18/18 159 lb (72.1 kg)  01/20/18 155 lb (70.3 kg)    Physical Exam  Results for orders placed or performed in visit on 01/20/18  POCT Urinalysis Dipstick  Result Value Ref Range   Color, UA yellow    Clarity, UA clear    Glucose, UA Negative Negative   Bilirubin, UA neg    Ketones, UA neg    Spec Grav, UA >=1.030 (A) 1.010 - 1.025   Blood, UA 2+    pH, UA 6.0 5.0 - 8.0   Protein, UA Negative Negative   Urobilinogen, UA 0.2 0.2 or 1.0 E.U./dL   Nitrite, UA neg    Leukocytes, UA Small (1+) (A) Negative   Appearance     Odor        Assessment & Plan:   Problem List Items Addressed This Visit      Cardiovascular and Mediastinum   Hot flashes    Options reviewed, including antidepressants, clonidine, higher dose gabapentin. Will try evening primrose and black cohosh for now. Will run labs to check hormone levels if not improving over next 6 months        Musculoskeletal and Integument    Melasma    Continue management per Dermatology with skin lightening creams        Other   Adult ADHD (attention deficit hyperactivity disorder) (Chronic)    Under good control, continue current regimen. Will send refills for 3 months      Depo-Provera contraceptive status (Chronic)    1 month late on her depo, urine pregnancy negative today and depo given. Follow up in 3 months for next injection       Other Visit Diagnoses    Immunization due    -  Primary   Relevant Orders   Flu Vaccine QUAD 6+ mos PF IM (Fluarix Quad PF) (Completed)   Annual physical exam           Follow up plan: Return in about 3 months (around 08/09/2018) for ADHD f/u.   LABORATORY TESTING:  - Pap smear: up to date  IMMUNIZATIONS:   - Tdap: Tetanus vaccination status reviewed: last tetanus booster within 10 years. - Influenza: Administered today  PATIENT COUNSELING:   Advised to take 1 mg of folate supplement per day if capable of pregnancy.   Sexuality: Discussed sexually transmitted diseases, partner selection, use of condoms, avoidance of unintended pregnancy  and contraceptive alternatives.   Advised to avoid cigarette smoking.  I discussed with the patient that most people either abstain from alcohol or drink within safe limits (<=14/week and <=4 drinks/occasion for males, <=7/weeks and <= 3 drinks/occasion for females) and that the risk for alcohol disorders and other health effects rises proportionally with the number of drinks per week and how often a drinker  exceeds daily limits.  Discussed cessation/primary prevention of drug use and availability of treatment for abuse.   Diet: Encouraged to adjust caloric intake to maintain  or achieve ideal body weight, to reduce intake of dietary saturated fat and total fat, to limit sodium intake by avoiding high sodium foods and not adding table salt, and to maintain adequate dietary potassium and calcium preferably from fresh fruits, vegetables, and  low-fat dairy products.    stressed the importance of regular exercise  Injury prevention: Discussed safety belts, safety helmets, smoke detector, smoking near bedding or upholstery.   Dental health: Discussed importance of regular tooth brushing, flossing, and dental visits.    NEXT PREVENTATIVE PHYSICAL DUE IN 1 YEAR. Return in about 3 months (around 08/09/2018) for ADHD f/u.

## 2018-05-09 NOTE — Assessment & Plan Note (Signed)
Options reviewed, including antidepressants, clonidine, higher dose gabapentin. Will try evening primrose and black cohosh for now. Will run labs to check hormone levels if not improving over next 6 months

## 2018-05-09 NOTE — Assessment & Plan Note (Signed)
1 month late on her depo, urine pregnancy negative today and depo given. Follow up in 3 months for next injection

## 2018-05-09 NOTE — Assessment & Plan Note (Signed)
Continue management per Dermatology with skin lightening creams

## 2018-05-09 NOTE — Patient Instructions (Signed)
Follow up in 3 months

## 2018-05-09 NOTE — Assessment & Plan Note (Signed)
Under good control, continue current regimen. Will send refills for 3 months

## 2018-05-10 LAB — CBC WITH DIFFERENTIAL/PLATELET
BASOS ABS: 0 10*3/uL (ref 0.0–0.2)
Basos: 1 %
EOS (ABSOLUTE): 0.2 10*3/uL (ref 0.0–0.4)
EOS: 4 %
HEMATOCRIT: 43.5 % (ref 34.0–46.6)
Hemoglobin: 14.1 g/dL (ref 11.1–15.9)
IMMATURE GRANULOCYTES: 0 %
Immature Grans (Abs): 0 10*3/uL (ref 0.0–0.1)
Lymphocytes Absolute: 2.6 10*3/uL (ref 0.7–3.1)
Lymphs: 40 %
MCH: 29.5 pg (ref 26.6–33.0)
MCHC: 32.4 g/dL (ref 31.5–35.7)
MCV: 91 fL (ref 79–97)
MONOS ABS: 0.5 10*3/uL (ref 0.1–0.9)
Monocytes: 9 %
Neutrophils Absolute: 3 10*3/uL (ref 1.4–7.0)
Neutrophils: 46 %
Platelets: 319 10*3/uL (ref 150–450)
RBC: 4.78 x10E6/uL (ref 3.77–5.28)
RDW: 13.1 % (ref 12.3–15.4)
WBC: 6.4 10*3/uL (ref 3.4–10.8)

## 2018-05-10 LAB — COMPREHENSIVE METABOLIC PANEL
ALK PHOS: 52 IU/L (ref 39–117)
ALT: 13 IU/L (ref 0–32)
AST: 15 IU/L (ref 0–40)
Albumin/Globulin Ratio: 2.1 (ref 1.2–2.2)
Albumin: 4.6 g/dL (ref 3.5–5.5)
BUN/Creatinine Ratio: 17 (ref 9–23)
BUN: 13 mg/dL (ref 6–20)
Bilirubin Total: 0.3 mg/dL (ref 0.0–1.2)
CALCIUM: 9 mg/dL (ref 8.7–10.2)
CO2: 22 mmol/L (ref 20–29)
CREATININE: 0.75 mg/dL (ref 0.57–1.00)
Chloride: 105 mmol/L (ref 96–106)
GFR calc Af Amer: 117 mL/min/{1.73_m2} (ref >59)
GFR, EST NON AFRICAN AMERICAN: 101 mL/min/{1.73_m2} (ref >59)
GLUCOSE: 79 mg/dL (ref 65–99)
Globulin, Total: 2.2 g/dL (ref 1.5–4.5)
Potassium: 4.3 mmol/L (ref 3.5–5.2)
Sodium: 142 mmol/L (ref 134–144)
Total Protein: 6.8 g/dL (ref 6.0–8.5)

## 2018-05-10 LAB — LIPID PANEL W/O CHOL/HDL RATIO
CHOLESTEROL TOTAL: 206 mg/dL — AB (ref 100–199)
HDL: 61 mg/dL (ref >39)
LDL CALC: 117 mg/dL — AB (ref 0–99)
TRIGLYCERIDES: 138 mg/dL (ref 0–149)
VLDL Cholesterol Cal: 28 mg/dL (ref 5–40)

## 2018-05-10 LAB — TSH: TSH: 1.62 u[IU]/mL (ref 0.450–4.500)

## 2018-06-20 ENCOUNTER — Other Ambulatory Visit: Payer: Self-pay | Admitting: Family Medicine

## 2018-06-20 ENCOUNTER — Encounter: Payer: Self-pay | Admitting: Family Medicine

## 2018-06-20 MED ORDER — AMPHETAMINE-DEXTROAMPHET ER 20 MG PO CP24: 20.0000 mg | ORAL_CAPSULE | ORAL | 0 refills | Status: DC

## 2018-07-12 ENCOUNTER — Encounter: Payer: Self-pay | Admitting: Family Medicine

## 2018-07-14 ENCOUNTER — Telehealth: Payer: Self-pay | Admitting: Family Medicine

## 2018-07-14 ENCOUNTER — Encounter: Payer: Self-pay | Admitting: Family Medicine

## 2018-07-14 NOTE — Telephone Encounter (Signed)
Received mychart message from patient requesting a phone call - left VM for her to return call

## 2018-07-14 NOTE — Telephone Encounter (Signed)
Noted. Will close encounter until patient calls back.

## 2018-07-15 NOTE — Telephone Encounter (Signed)
Pt returned call yesterday 1/2, wanted to ask about getting back on phentermine for weight control. Discussed that we do not write that here but she could discuss with a Women's Health provider about this matter as they will sometimes manage it. Pt agreeable to reaching out to them about this

## 2018-07-26 ENCOUNTER — Encounter: Payer: Self-pay | Admitting: Family Medicine

## 2018-07-27 ENCOUNTER — Other Ambulatory Visit: Payer: Self-pay | Admitting: Family Medicine

## 2018-07-27 ENCOUNTER — Encounter: Payer: Self-pay | Admitting: Family Medicine

## 2018-07-27 DIAGNOSIS — Z0100 Encounter for examination of eyes and vision without abnormal findings: Secondary | ICD-10-CM

## 2018-07-27 MED ORDER — CYCLOBENZAPRINE HCL 10 MG PO TABS: 10.0000 mg | ORAL_TABLET | Freq: Three times a day (TID) | ORAL | 1 refills | Status: DC | PRN

## 2018-07-27 MED ORDER — AMPHETAMINE-DEXTROAMPHET ER 20 MG PO CP24: 20.0000 mg | ORAL_CAPSULE | ORAL | 0 refills | Status: DC

## 2018-08-04 ENCOUNTER — Encounter: Payer: Self-pay | Admitting: Family Medicine

## 2018-08-05 NOTE — Telephone Encounter (Signed)
Called pt as requested, she states she thinks her BV infection from this summer is back. States the store had run out of her probiotic recently and since has had returning sxs. Just ordered boric acid and more probiotics. Discussed needing OV to get tested to make sure nothing else was also going on, pt states she will give the two supplements some time and come in if not improving

## 2018-08-09 ENCOUNTER — Other Ambulatory Visit: Payer: Self-pay | Admitting: Family Medicine

## 2018-08-09 NOTE — Telephone Encounter (Signed)
Requested Prescriptions  Pending Prescriptions Disp Refills  . gabapentin (NEURONTIN) 300 MG capsule [Pharmacy Med Name: GABAPENTIN 300 MG CAPSULE 300 CAP] 270 capsule 1    Sig: TAKE 1 CAPSULE BY MOUTH THREE TIMES DAILY     Neurology: Anticonvulsants - gabapentin Passed - 08/09/2018  7:31 AM      Passed - Valid encounter within last 12 months    Recent Outpatient Visits          3 months ago Immunization due   [facility], Salley Hews, [place]   5 months ago Adult ADHD (attention deficit hyperactivity disorder)   [facility] [place] Nearing, [place]   9 months ago Encounter for surveillance of injectable contraceptive   [facility] [place], [place]   11 months ago Influenza A   [facility] [place], [place]   1 year ago Adult ADHD (attention deficit hyperactivity disorder)   [facility], Salley Hews, [place]      Future Appointments            In 3 days Maurice March, Salley Hews, PA-C [facility]

## 2018-08-12 ENCOUNTER — Ambulatory Visit (INDEPENDENT_AMBULATORY_CARE_PROVIDER_SITE_OTHER): Payer: No Typology Code available for payment source | Admitting: Family Medicine

## 2018-08-12 ENCOUNTER — Encounter: Payer: Self-pay | Admitting: Family Medicine

## 2018-08-12 VITALS — BP 149/99 | HR 97 | Temp 98.3°F | Wt 160.0 lb

## 2018-08-12 DIAGNOSIS — F909 Attention-deficit hyperactivity disorder, unspecified type: Secondary | ICD-10-CM

## 2018-08-12 DIAGNOSIS — N898 Other specified noninflammatory disorders of vagina: Secondary | ICD-10-CM

## 2018-08-12 DIAGNOSIS — Z3042 Encounter for surveillance of injectable contraceptive: Secondary | ICD-10-CM | POA: Diagnosis not present

## 2018-08-12 LAB — PREGNANCY, URINE: Preg Test, Ur: NEGATIVE

## 2018-08-12 LAB — WET PREP FOR TRICH, YEAST, CLUE
Clue Cell Exam: NEGATIVE
TRICHOMONAS EXAM: NEGATIVE
Yeast Exam: NEGATIVE

## 2018-08-12 MED ORDER — AMPHETAMINE-DEXTROAMPHET ER 20 MG PO CP24: 20.0000 mg | ORAL_CAPSULE | ORAL | 0 refills | Status: DC

## 2018-08-12 MED ORDER — AMPHETAMINE-DEXTROAMPHET ER 20 MG PO CP24: 20.0000 mg | ORAL_CAPSULE | Freq: Every day | ORAL | 0 refills | Status: DC

## 2018-08-12 NOTE — Assessment & Plan Note (Signed)
Stable, continue current regimen 

## 2018-08-12 NOTE — Progress Notes (Signed)
BP (!) 149/99   Pulse 97   Temp 98.3 F (36.8 C) (Oral)   Wt 160 lb (72.6 kg)   SpO2 99%   BMI 27.04 kg/m    Subjective:    Patient ID: Amber Santana, female    DOB: Nov 24, 1980, 38 y.o.   MRN: [id number]  HPI: Amber Santana is a 38 y.o. female  Chief Complaint  Patient presents with  . ADHD  . Vaginal Discharge    pt states she has been having discharge for about 3 weeks    Here today for 3 month ADHD f/u. Doing well on adderall XR 20 mg. Notes significant improvement in focus and task completion at work. Denies CP, palpitations, appetite or sleep issues.   Dealing with about 3 weeks of vaginal discharge, worried the BV is back. Has been doing probiotics which seem to keep things at bay but ran out a few weeks ago. Restarted about a week ago and notes improvement. No dysuria, pelvic pain, fevers, abdominal pain, nausea, vomiting.   Relevant past medical, surgical, family and social history reviewed and updated as indicated. Interim medical history since our last visit reviewed. Allergies and medications reviewed and updated.  Review of Systems  Per HPI unless specifically indicated above     Objective:    BP (!) 149/99   Pulse 97   Temp 98.3 F (36.8 C) (Oral)   Wt 160 lb (72.6 kg)   SpO2 99%   BMI 27.04 kg/m   Wt Readings from Last 3 Encounters:  08/12/18 160 lb (72.6 kg)  05/09/18 164 lb 8 oz (74.6 kg)  02/18/18 159 lb (72.1 kg)    Physical Exam Vitals signs and nursing note reviewed.  Constitutional:      Appearance: Normal appearance. She is not ill-appearing.  HENT:     Head: Atraumatic.  Eyes:     Extraocular Movements: Extraocular movements intact.     Conjunctiva/sclera: Conjunctivae normal.  Neck:     Musculoskeletal: Normal range of motion and neck supple.  Cardiovascular:     Rate and Rhythm: Normal rate and regular rhythm.     Heart sounds: Normal heart sounds.  Pulmonary:     Effort: Pulmonary effort is normal.     Breath sounds:  Normal breath sounds.  Musculoskeletal: Normal range of motion.  Skin:    General: Skin is warm and dry.  Neurological:     Mental Status: She is alert and oriented to person, place, and time.  Psychiatric:        Mood and Affect: Mood normal.        Thought Content: Thought content normal.        Judgment: Judgment normal.     Results for orders placed or performed in visit on 08/12/18  WET PREP FOR TRICH, YEAST, CLUE  Result Value Ref Range   Trichomonas Exam Negative Negative   Yeast Exam Negative Negative   Clue Cell Exam Negative Negative  Pregnancy, urine  Result Value Ref Range   Preg Test, Ur Negative Negative      Assessment & Plan:   Problem List Items Addressed This Visit      Other   Adult ADHD (attention deficit hyperactivity disorder) - Primary (Chronic)    Stable, continue current regimen       Other Visit Diagnoses    Vaginal discharge       Wet prep neg, continue probiotics and good vaginal hygiene   Relevant Orders  WET PREP FOR TRICH, YEAST, CLUE (Completed)   Depot contraception       urine preg neg, depo injection given today   Encounter for Depo-Provera contraception       Relevant Orders   Pregnancy, urine (Completed)       Follow up plan: Return in about 3 months (around 11/10/2018) for ADHD.

## 2018-09-08 ENCOUNTER — Encounter: Payer: Self-pay | Admitting: Family Medicine

## 2018-09-08 NOTE — Telephone Encounter (Signed)
Pt should have scripts for 2/15, 3/15, and 4/15 - please call pharmacy and verify

## 2018-09-09 NOTE — Telephone Encounter (Signed)
Please let pt know she has scripts on file

## 2018-09-09 NOTE — Telephone Encounter (Signed)
done

## 2018-09-09 NOTE — Telephone Encounter (Signed)
Pharmacy confirmed RXs on file.

## 2018-10-04 ENCOUNTER — Encounter: Payer: Self-pay | Admitting: Family Medicine

## 2018-10-05 ENCOUNTER — Other Ambulatory Visit: Payer: Self-pay | Admitting: Family Medicine

## 2018-10-05 DIAGNOSIS — E663 Overweight: Secondary | ICD-10-CM

## 2018-10-30 ENCOUNTER — Encounter: Payer: Self-pay | Admitting: Family Medicine

## 2018-10-31 NOTE — Telephone Encounter (Signed)
Patient scheduled for Wednesday at 8:00am for FaceTime visit with Fleet Contras. Depo scheduled for Tuesday afternoon.  Routing provider as Lorain Childes.

## 2018-11-01 ENCOUNTER — Ambulatory Visit (INDEPENDENT_AMBULATORY_CARE_PROVIDER_SITE_OTHER): Payer: No Typology Code available for payment source

## 2018-11-01 ENCOUNTER — Other Ambulatory Visit: Payer: Self-pay

## 2018-11-01 DIAGNOSIS — Z3042 Encounter for surveillance of injectable contraceptive: Secondary | ICD-10-CM | POA: Diagnosis not present

## 2018-11-01 NOTE — Progress Notes (Signed)
Patient given next depo dates of 01/17/19-01/31/19

## 2018-11-02 ENCOUNTER — Encounter: Payer: Self-pay | Admitting: Family Medicine

## 2018-11-02 ENCOUNTER — Other Ambulatory Visit: Payer: Self-pay

## 2018-11-02 ENCOUNTER — Ambulatory Visit (INDEPENDENT_AMBULATORY_CARE_PROVIDER_SITE_OTHER): Payer: No Typology Code available for payment source | Admitting: Family Medicine

## 2018-11-02 VITALS — Ht 63.5 in

## 2018-11-02 DIAGNOSIS — F909 Attention-deficit hyperactivity disorder, unspecified type: Secondary | ICD-10-CM | POA: Diagnosis not present

## 2018-11-02 MED ORDER — AMPHETAMINE-DEXTROAMPHET ER 20 MG PO CP24: 20.0000 mg | ORAL_CAPSULE | ORAL | 0 refills | Status: DC

## 2018-11-02 NOTE — Assessment & Plan Note (Signed)
Chronic, stable and under good control. Continue current regimen °

## 2018-11-02 NOTE — Progress Notes (Signed)
Ht 5' 3.5" (1.613 m)   BMI 27.90 kg/m    Subjective:    Patient ID: Amber Santana, female    DOB: April 21, 1981, 38 y.o.   MRN: [id number]  HPI: Amber Santana is a 38 y.o. female  Chief Complaint  Patient presents with  . ADHD    adderall refill    . This visit was completed via WebEx due to the restrictions of the COVID-19 pandemic. All issues as above were discussed and addressed. Physical exam was done as above through visual confirmation on WebEx. If it was felt that the patient should be evaluated in the office, they were directed there. The patient verbally consented to this visit. . Location of the patient: work . Location of the provider: work . Those involved with this call:  . Provider: Roosvelt Maser, PA-C . CMA: Elton Sin, CMA . Front Desk/Registration: Harriet Pho  . Time spent on call: 15 minutes with patient face to face via video conference. More than 50% of this time was spent in counseling and coordination of care. 5 minutes total spent in review of patient's record and preparation of their chart.  I verified patient identity using two factors (patient name and date of birth). Patient consents verbally to being seen via telemedicine visit today.   Here today for ADHD f/u. Currently on 20 mg adderall XR which works very well without side effects. Denies CP, SOB, palpitations, HAs, sleep or appetite issues. Notes excellent benefit with the medication both with focus and task completion. No new concerns today.  Relevant past medical, surgical, family and social history reviewed and updated as indicated. Interim medical history since our last visit reviewed. Allergies and medications reviewed and updated.  Review of Systems  Per HPI unless specifically indicated above     Objective:    Ht 5' 3.5" (1.613 m)   BMI 27.90 kg/m   Wt Readings from Last 3 Encounters:  08/12/18 160 lb (72.6 kg)  05/09/18 164 lb 8 oz (74.6 kg)  02/18/18 159 lb (72.1 kg)     Physical Exam Vitals signs and nursing note reviewed.  Constitutional:      General: She is not in acute distress.    Appearance: Normal appearance.  HENT:     Head: Atraumatic.     Right Ear: External ear normal.     Left Ear: External ear normal.     Nose: Nose normal. No congestion.     Mouth/Throat:     Mouth: Mucous membranes are moist.     Pharynx: Oropharynx is clear. No posterior oropharyngeal erythema.  Eyes:     Extraocular Movements: Extraocular movements intact.     Conjunctiva/sclera: Conjunctivae normal.  Neck:     Musculoskeletal: Normal range of motion.  Cardiovascular:     Comments: Unable to assess via virtual visit Pulmonary:     Effort: Pulmonary effort is normal. No respiratory distress.  Musculoskeletal: Normal range of motion.  Skin:    General: Skin is dry.     Findings: No erythema.  Neurological:     Mental Status: She is alert and oriented to person, place, and time.  Psychiatric:        Mood and Affect: Mood normal.        Thought Content: Thought content normal.        Judgment: Judgment normal.     Results for orders placed or performed in visit on 08/12/18  WET PREP FOR TRICH, YEAST, CLUE  Result  Value Ref Range   Trichomonas Exam Negative Negative   Yeast Exam Negative Negative   Clue Cell Exam Negative Negative  Pregnancy, urine  Result Value Ref Range   Preg Test, Ur Negative Negative      Assessment & Plan:   Problem List Items Addressed This Visit      Other   Adult ADHD (attention deficit hyperactivity disorder) - Primary (Chronic)    Chronic, stable and under good control. Continue current regimen.           Follow up plan: Return in about 3 months (around 02/01/2019) for ADD.

## 2018-11-04 ENCOUNTER — Ambulatory Visit: Payer: No Typology Code available for payment source | Admitting: Family Medicine

## 2018-11-21 ENCOUNTER — Other Ambulatory Visit: Payer: Self-pay | Admitting: Family Medicine

## 2018-11-21 NOTE — Telephone Encounter (Signed)
Requested Prescriptions  Pending Prescriptions Disp Refills  . gabapentin (NEURONTIN) 300 MG capsule [Pharmacy Med Name: GABAPENTIN 300 MG CAPSULE 300 CAP] 270 capsule 0    Sig: TAKE 1 CAPSULE BY MOUTH THREE TIMES DAILY     Neurology: Anticonvulsants - gabapentin Passed - 11/21/2018  3:14 PM      Passed - Valid encounter within last 12 months    Recent Outpatient Visits          2 weeks ago Adult ADHD (attention deficit hyperactivity disorder)   [facility] Particia Nearing, [place]   3 months ago Adult ADHD (attention deficit hyperactivity disorder)   [facility] Particia Nearing, [place]   6 months ago Immunization due   [facility] Maurice March, [place], [place]   9 months ago Adult ADHD (attention deficit hyperactivity disorder)   [facility] Particia Nearing, [place]   1 year ago Encounter for surveillance of injectable contraceptive   [facility] [place], Salley Hews, [place]      Future Appointments            In 2 months Maurice March, [place] Hews, PA-C [facility]

## 2019-01-03 ENCOUNTER — Ambulatory Visit (INDEPENDENT_AMBULATORY_CARE_PROVIDER_SITE_OTHER): Payer: No Typology Code available for payment source | Admitting: Family Medicine

## 2019-01-03 ENCOUNTER — Other Ambulatory Visit: Payer: Self-pay

## 2019-01-03 ENCOUNTER — Encounter: Payer: Self-pay | Admitting: Family Medicine

## 2019-01-03 VITALS — BP 142/100 | HR 109 | Wt 146.0 lb

## 2019-01-03 DIAGNOSIS — R110 Nausea: Secondary | ICD-10-CM

## 2019-01-03 DIAGNOSIS — G43109 Migraine with aura, not intractable, without status migrainosus: Secondary | ICD-10-CM

## 2019-01-03 MED ORDER — ONDANSETRON 4 MG PO TBDP: 4.0000 mg | ORAL_TABLET | Freq: Three times a day (TID) | ORAL | 0 refills | Status: DC | PRN

## 2019-01-03 MED ORDER — SUMATRIPTAN SUCCINATE 50 MG PO TABS: ORAL_TABLET | ORAL | 0 refills | Status: AC

## 2019-01-03 MED ORDER — KETOROLAC TROMETHAMINE 60 MG/2ML IM SOLN
60.0000 mg | Freq: Once | INTRAMUSCULAR | Status: AC
  Administered 2019-01-03: 60 mg via INTRAMUSCULAR

## 2019-01-03 NOTE — Progress Notes (Signed)
BP (!) 142/100   Pulse (!) 109   Wt 146 lb (66.2 kg)   BMI 25.46 kg/m    Subjective:    Patient ID: Amber MeiersJulie M Soots, female    DOB: 07/08/1981, 38 y.o.   MRN: [id number]  HPI: Amber MeiersJulie M Gledhill is a 38 y.o. female  Chief Complaint  Patient presents with  . Migraine    . This visit was completed via WebEx due to the restrictions of the COVID-19 pandemic. All issues as above were discussed and addressed. Physical exam was done as above through visual confirmation on WebEx. If it was felt that the patient should be evaluated in the office, they were directed there. The patient verbally consented to this visit. . Location of the patient: parking lot . Location of the provider: home . Those involved with this call:  . Provider: Roosvelt Maserachel Lane, PA-C . CMA: Tiffany Reel, CMA . Front Desk/Registration: Harriet PhoJoliza Johnson  . Time spent on call: 15 minutes with patient face to face via video conference. More than 50% of this time was spent in counseling and coordination of care. 5 minutes total spent in review of patient's record and preparation of their chart. I verified patient identity using two factors (patient name and date of birth). Patient consents verbally to being seen via telemedicine visit today.   Presenting today for a migraine that has come on the past day or so. Having photophobia, nausea, and sharp headache. Has not yet tried anything for this. Used to have migraines frequently but the past few years only has had mild headaches until now. Denies confusion, dizziness, falls, head injury.   Relevant past medical, surgical, family and social history reviewed and updated as indicated. Interim medical history since our last visit reviewed. Allergies and medications reviewed and updated.  Review of Systems  Per HPI unless specifically indicated above     Objective:    BP (!) 142/100   Pulse (!) 109   Wt 146 lb (66.2 kg)   BMI 25.46 kg/m   Wt Readings from Last 3 Encounters:   01/03/19 146 lb (66.2 kg)  08/12/18 160 lb (72.6 kg)  05/09/18 164 lb 8 oz (74.6 kg)    Physical Exam Vitals signs and nursing note reviewed.  Constitutional:      General: She is not in acute distress.    Appearance: Normal appearance.  HENT:     Head: Atraumatic.     Right Ear: External ear normal.     Left Ear: External ear normal.     Nose: Nose normal. No congestion.     Mouth/Throat:     Mouth: Mucous membranes are moist.     Pharynx: Oropharynx is clear. No posterior oropharyngeal erythema.  Eyes:     Extraocular Movements: Extraocular movements intact.     Conjunctiva/sclera: Conjunctivae normal.  Neck:     Musculoskeletal: Normal range of motion.  Cardiovascular:     Comments: Unable to assess via virtual visit Pulmonary:     Effort: Pulmonary effort is normal. No respiratory distress.  Musculoskeletal: Normal range of motion.  Skin:    General: Skin is dry.     Findings: No erythema.  Neurological:     Mental Status: She is alert and oriented to person, place, and time.  Psychiatric:        Mood and Affect: Mood normal.        Thought Content: Thought content normal.        Judgment: Judgment normal.  Results for orders placed or performed in visit on 08/12/18  WET PREP FOR Bruce, YEAST, CLUE   Specimen: Vaginal Fluid   VAGINAL FLUI  Result Value Ref Range   Trichomonas Exam Negative Negative   Yeast Exam Negative Negative   Clue Cell Exam Negative Negative  Pregnancy, urine  Result Value Ref Range   Preg Test, Ur Negative Negative      Assessment & Plan:   Problem List Items Addressed This Visit    None    Visit Diagnoses    Migraine with aura and without status migrainosus, not intractable    -  Primary   Imitrex prn, IM toradol ordered and patient instructed to go by clinic for this. F/u if not resolving over next few days   Relevant Medications   ketorolac (TORADOL) injection 60 mg (Completed)   SUMAtriptan (IMITREX) 50 MG tablet    Nausea       zofran sent for prn use.        Follow up plan: Return if symptoms worsen or fail to improve.

## 2019-01-25 ENCOUNTER — Encounter: Payer: Self-pay | Admitting: Family Medicine

## 2019-01-27 ENCOUNTER — Ambulatory Visit: Payer: Self-pay | Admitting: Family Medicine

## 2019-01-27 ENCOUNTER — Other Ambulatory Visit: Payer: Self-pay

## 2019-01-27 ENCOUNTER — Ambulatory Visit (INDEPENDENT_AMBULATORY_CARE_PROVIDER_SITE_OTHER): Payer: No Typology Code available for payment source

## 2019-01-27 DIAGNOSIS — Z3042 Encounter for surveillance of injectable contraceptive: Secondary | ICD-10-CM | POA: Diagnosis not present

## 2019-02-03 ENCOUNTER — Ambulatory Visit: Payer: No Typology Code available for payment source | Admitting: Family Medicine

## 2019-02-06 ENCOUNTER — Other Ambulatory Visit: Payer: Self-pay

## 2019-02-06 ENCOUNTER — Encounter: Payer: Self-pay | Admitting: Family Medicine

## 2019-02-06 ENCOUNTER — Ambulatory Visit (INDEPENDENT_AMBULATORY_CARE_PROVIDER_SITE_OTHER): Payer: No Typology Code available for payment source | Admitting: Family Medicine

## 2019-02-06 VITALS — Ht 63.0 in | Wt 143.0 lb

## 2019-02-06 DIAGNOSIS — B370 Candidal stomatitis: Secondary | ICD-10-CM | POA: Diagnosis not present

## 2019-02-06 DIAGNOSIS — B373 Candidiasis of vulva and vagina: Secondary | ICD-10-CM | POA: Diagnosis not present

## 2019-02-06 DIAGNOSIS — N390 Urinary tract infection, site not specified: Secondary | ICD-10-CM

## 2019-02-06 DIAGNOSIS — F909 Attention-deficit hyperactivity disorder, unspecified type: Secondary | ICD-10-CM

## 2019-02-06 DIAGNOSIS — B3731 Acute candidiasis of vulva and vagina: Secondary | ICD-10-CM

## 2019-02-06 LAB — WET PREP FOR TRICH, YEAST, CLUE
Clue Cell Exam: NEGATIVE
Trichomonas Exam: NEGATIVE
Yeast Exam: POSITIVE — AB

## 2019-02-06 MED ORDER — SULFAMETHOXAZOLE-TRIMETHOPRIM 800-160 MG PO TABS: 1.0000 | ORAL_TABLET | Freq: Two times a day (BID) | ORAL | 0 refills | Status: DC

## 2019-02-06 MED ORDER — NYSTATIN 100000 UNIT/ML MT SUSP: 5.0000 mL | Freq: Four times a day (QID) | OROMUCOSAL | 0 refills | Status: DC

## 2019-02-06 MED ORDER — FLUCONAZOLE 150 MG PO TABS: 150.0000 mg | ORAL_TABLET | ORAL | 0 refills | Status: DC

## 2019-02-06 NOTE — Progress Notes (Deleted)
wet 

## 2019-02-06 NOTE — Progress Notes (Signed)
Ht 5\' 3"  (1.6 m)   Wt 143 lb (64.9 kg)   BMI 25.33 kg/m    Subjective:    Patient ID: Amber Santana, female    DOB: [birth year], 38 y.o.   MRN: [id number]  HPI: Amber Santana is a 38 y.o. female  Chief Complaint  Patient presents with  . Dysuria    Ongoing 6 days  . Vaginal Itching    . This visit was completed via WebEx due to the restrictions of the COVID-19 pandemic. All issues as above were discussed and addressed. Physical exam was done as above through visual confirmation on WebEx. If it was felt that the patient should be evaluated in the office, they were directed there. The patient verbally consented to this visit. . Location of the patient: work . Location of the provider: home . Those involved with this call:  . Provider: Merrie Roof, PA-C . CMA: Merilyn Baba, [place] . Front Desk/Registration: Jill Side  . Time spent on call: 15 minutes with patient face to face via video conference. More than 50% of this time was spent in counseling and coordination of care. 5 minutes total spent in review of patient's record and preparation of their chart. I verified patient identity using two factors (patient name and date of birth). Patient consents verbally to being seen via telemedicine visit today.   Patient presenting today with almost a week of dysuria, frequency, and vaginal itching since [place] the beach. Has not tried anything OTC other than drinking more water. Denies fevers, chills, abdominal pain, N/V, hematuria, concern for STIs. Hx of UTIs and yeast infections.   Also due for adderall refill for her ADHD. Continues to do well on this regimen. Tolerating medicine well and feels major benefit as far as focus, task completion.   Relevant past medical, surgical, family and social history reviewed and updated as indicated. Interim medical history since our last visit reviewed. Allergies and medications reviewed and updated.  Review of Systems  Per HPI unless  specifically indicated above     Objective:    Ht 5\' 3"  (1.6 m)   Wt 143 lb (64.9 kg)   BMI 25.33 kg/m   Wt Readings from Last 3 Encounters:  02/06/19 143 lb (64.9 kg)  01/03/19 146 lb (66.2 kg)  08/12/18 160 lb (72.6 kg)    Physical Exam Vitals signs and nursing note reviewed.  Constitutional:      General: She is not in acute distress.    Appearance: Normal appearance.  HENT:     Head: Atraumatic.     Right Ear: External ear normal.     Left Ear: External ear normal.     Nose: Nose normal. No congestion.     Mouth/Throat:     Mouth: Mucous membranes are moist.     Pharynx: Oropharynx is clear. No posterior oropharyngeal erythema.  Eyes:     Extraocular Movements: Extraocular movements intact.     Conjunctiva/sclera: Conjunctivae normal.  Neck:     Musculoskeletal: Normal range of motion.  Cardiovascular:     Comments: Unable to assess via virtual visit Pulmonary:     Effort: Pulmonary effort is normal. No respiratory distress.  Abdominal:     Comments: Abdomen nontender to patient's self exam  Musculoskeletal: Normal range of motion.  Skin:    General: Skin is dry.     Findings: No erythema.  Neurological:     Mental Status: She is alert and oriented to person,  place, and time.  Psychiatric:        Mood and Affect: Mood normal.        Thought Content: Thought content normal.        Judgment: Judgment normal.     Results for orders placed or performed in visit on 02/06/19  WET PREP FOR TRICH, YEAST, CLUE   Specimen: Urine   URINE  Result Value Ref Range   Trichomonas Exam Negative Negative   Yeast Exam Positive (A) Negative   Clue Cell Exam Negative Negative  Microscopic Examination   URINE  Result Value Ref Range   WBC, UA >30 (A) 0 - 5 /hpf   RBC 0-2 0 - 2 /hpf   Epithelial Cells (non renal) 0-10 0 - 10 /hpf   Bacteria, UA Many (A) None seen/Few  Urine Culture, Reflex   URINE  Result Value Ref Range   Urine Culture, Routine Final report (A)     Organism ID, Bacteria Escherichia coli (A)    Antimicrobial Susceptibility Comment   UA/M w/rflx Culture, Routine   Specimen: Urine   URINE  Result Value Ref Range   Specific Gravity, UA 1.025 1.005 - 1.030   pH, UA 6.0 5.0 - 7.5   Color, UA Yellow Yellow   Appearance Ur Cloudy (A) Clear   Leukocytes,UA 1+ (A) Negative   Protein,UA 1+ (A) Negative/Trace   Glucose, UA Negative Negative   Ketones, UA 1+ (A) Negative   RBC, UA 2+ (A) Negative   Bilirubin, UA Negative Negative   Urobilinogen, Ur 0.2 0.2 - 1.0 mg/dL   Nitrite, UA Positive (A) Negative   Microscopic Examination See below:    Urinalysis Reflex Comment       Assessment & Plan:   Problem List Items Addressed This Visit      Other   Adult ADHD (attention deficit hyperactivity disorder) (Chronic)    Stable and under good control, continue current regimen       Other Visit Diagnoses    Acute lower UTI    -  Primary   Tx with bactrim, await cx results. F/u if not improving   Relevant Medications   nystatin (MYCOSTATIN) 100000 UNIT/ML suspension   fluconazole (DIFLUCAN) 150 MG tablet   sulfamethoxazole-trimethoprim (BACTRIM DS) 800-160 MG tablet   Other Relevant Orders   UA/M w/rflx Culture, Routine (Completed)   WET PREP FOR TRICH, YEAST, CLUE (Completed)   Vaginal candidiasis       Tx with diflucan, probiotics   Relevant Medications   nystatin (MYCOSTATIN) 100000 UNIT/ML suspension   fluconazole (DIFLUCAN) 150 MG tablet   sulfamethoxazole-trimethoprim (BACTRIM DS) 800-160 MG tablet   Thrush       States she tends to get thrush with abx, nystatin mouthwash sent   Relevant Medications   nystatin (MYCOSTATIN) 100000 UNIT/ML suspension   fluconazole (DIFLUCAN) 150 MG tablet   sulfamethoxazole-trimethoprim (BACTRIM DS) 800-160 MG tablet       Follow up plan: Return for as scheduled.

## 2019-02-08 LAB — MICROSCOPIC EXAMINATION: WBC, UA: >30 /hpf — AB (ref 0–5)

## 2019-02-08 LAB — UA/M W/RFLX CULTURE, ROUTINE
Bilirubin, UA: NEGATIVE
Glucose, UA: NEGATIVE
Nitrite, UA: POSITIVE — AB
Specific Gravity, UA: 1.025 (ref 1.005–1.030)
Urobilinogen, Ur: 0.2 mg/dL (ref 0.2–1.0)
pH, UA: 6 (ref 5.0–7.5)

## 2019-02-08 LAB — URINE CULTURE, REFLEX

## 2019-02-09 ENCOUNTER — Encounter: Payer: Self-pay | Admitting: Family Medicine

## 2019-02-09 MED ORDER — AMPHETAMINE-DEXTROAMPHET ER 20 MG PO CP24: 20.0000 mg | ORAL_CAPSULE | ORAL | 0 refills | Status: DC

## 2019-02-09 NOTE — Assessment & Plan Note (Signed)
Stable and under good control, continue current regimen 

## 2019-02-16 ENCOUNTER — Other Ambulatory Visit: Payer: Self-pay | Admitting: Family Medicine

## 2019-02-16 ENCOUNTER — Encounter: Payer: Self-pay | Admitting: Family Medicine

## 2019-02-16 MED ORDER — NITROFURANTOIN MONOHYD MACRO 100 MG PO CAPS: 100.0000 mg | ORAL_CAPSULE | Freq: Two times a day (BID) | ORAL | 0 refills | Status: DC

## 2019-03-03 ENCOUNTER — Telehealth: Payer: Self-pay | Admitting: Physician Assistant

## 2019-03-03 ENCOUNTER — Telehealth: Payer: Self-pay

## 2019-03-03 ENCOUNTER — Ambulatory Visit (INDEPENDENT_AMBULATORY_CARE_PROVIDER_SITE_OTHER)
Admission: RE | Admit: 2019-03-03 | Discharge: 2019-03-03 | Disposition: A | Discharge status: Home / Self Care | Payer: Self-pay | Source: Ambulatory Visit

## 2019-03-03 DIAGNOSIS — R21 Rash and other nonspecific skin eruption: Secondary | ICD-10-CM

## 2019-03-03 MED ORDER — SARNA 0.5-0.5 % EX LOTN: 1.0000 "application " | TOPICAL_LOTION | CUTANEOUS | 0 refills | Status: DC | PRN

## 2019-03-03 MED ORDER — MUPIROCIN 2 % EX OINT: 1.0000 "application " | TOPICAL_OINTMENT | Freq: Two times a day (BID) | CUTANEOUS | 0 refills | Status: DC

## 2019-03-03 MED ORDER — HYDROXYZINE HCL 25 MG PO TABS: 25.0000 mg | ORAL_TABLET | Freq: Four times a day (QID) | ORAL | 0 refills | Status: DC | PRN

## 2019-03-03 NOTE — Discharge Instructions (Signed)
Use Bactroban ointment on open rash on the back.  This will cover for impetigo, but will also prevent infection.  Hydroxyzine or allergy medicine such as Zyrtec/Claritin to help with itching.  You can use Sarna lotion to help with itching as well.  If noticing spreading rash, spreading redness, warmth, fever, pain, follow-up for reevaluation.

## 2019-03-03 NOTE — ED Provider Notes (Signed)
Virtual Visit via Video Note:  Amber Santana  initiated request for Telemedicine visit with [facility] team. I connected with Amber Santana  on 03/03/2019 at 4:27 PM  for a synchronized telemedicine visit using a video enabled HIPPA compliant telemedicine application. I verified that I am speaking with Amber Santana  using two identifiers. Amy Jodell Cipro, PA-C  was physically located in a [facility] Urgent care site and Amber Santana was located at a different location.   The limitations of evaluation and management by telemedicine as well as the availability of in-person appointments were discussed. Patient was informed that she  may incur a bill ( including co-pay) for this virtual visit encounter. Amber Santana  expressed understanding and gave verbal consent to proceed with virtual visit.     History of Present Illness:Amber Santana  is a 38 y.o. female presents with 1 week history of rash. This started in the back, and has open abrasions/wounds due to itching. Now with itching to the abdomen, under breasts. States no obvious rash to the abdomen/breast area. Denies spreading erythema, warmth. Rashes in the back are tender to touch, though may be due to open wound.   She has recent exposure to impetigo. States friend shares the same bed, although they do not sleep in the bed at the same time. Since friend was diagnosed with impetigo, bed sheets have been washed.   Past Medical History:  Diagnosis Date  . ADHD (attention deficit hyperactivity disorder)   . Arthritis    lower back  . Lumbago   . Migraines    1x/mo  . Neck stiffness    limited side to side mvmt.  OK up and down.  . Neuropathy     Allergies  Allergen Reactions  . Banana     Stomach pain     Observations/Objective: General: Well appearing, nontoxic, no acute distress. Sitting comfortably. Head: Normocephalic, atraumatic Eye: No conjunctival injection, eyelid swelling. EOMI ENT: Mucus  membranes moist, no lip cracking. No obvious nasal drainage. Pulm: Speaking in full sentences without difficulty. Normal effort. No respiratory distress, accessory muscle use. Skin: See picture below. No obvious golden crusting seen. Area is tender per patient. No warmth. No purulent drainage. No vesicles. Neuro: Normal mental status. Alert and oriented x 3.      Assessment and Plan: Lower suspicion for impetigo at this time. However, will provide bactroban on open wounds at this time to cover for impetigo and any skin infections. Symptomatic treatment for itching discussed, including hydroxyzine, other antihistamine, SARNA lotion. Return precautions given. Patient expresses understanding and agrees to plan.  Follow Up Instructions:    I discussed the assessment and treatment plan with the patient. The patient was provided an opportunity to ask questions and all were answered. The patient agreed with the plan and demonstrated an understanding of the instructions.   The patient was advised to call back or seek an in-person evaluation if the symptoms worsen or if the condition fails to improve as anticipated.  I provided 15 minutes of non-face-to-face time during this encounter.    Ok Edwards, PA-C  03/03/2019 4:27 PM         Ok Edwards, PA-C 03/03/19 [id number]

## 2019-03-03 NOTE — Progress Notes (Addendum)
Based on what you shared with me, I feel your condition warrants further evaluation and I recommend that you be seen for a face to face visit.  Please contact your primary care physician practice to be seen. Many offices offer virtual options to be seen via video if you are not comfortable going in person to a medical facility at this time.  If you do not have a PCP, [facility] offers a free physician referral service available at [phone]. Our trained staff has the experience, knowledge and resources to put you in touch with a physician who is right for you.   You also have the option of a video visit through [email or web address]  If you are having a true medical emergency please call 911.  NOTE: If you entered your credit card information for this eVisit, you will not be charged. You may see a "hold" on your card for the $35 but that hold will drop off and you will not have a charge processed.  Your e-visit answers were reviewed by a board certified advanced clinical practitioner to complete your personal care plan.  Thank you for using e-Visits.  Greater than 5 minutes, yet less than 10 minutes of time have been spent researching, coordinating, and implementing care for this patient today 

## 2019-03-13 ENCOUNTER — Encounter: Payer: Self-pay | Admitting: Family Medicine

## 2019-03-14 ENCOUNTER — Other Ambulatory Visit: Payer: Self-pay

## 2019-03-14 ENCOUNTER — Ambulatory Visit (INDEPENDENT_AMBULATORY_CARE_PROVIDER_SITE_OTHER): Payer: No Typology Code available for payment source | Admitting: Family Medicine

## 2019-03-14 DIAGNOSIS — R21 Rash and other nonspecific skin eruption: Secondary | ICD-10-CM

## 2019-03-14 MED ORDER — SULFAMETHOXAZOLE-TRIMETHOPRIM 800-160 MG PO TABS: 1.0000 | ORAL_TABLET | Freq: Two times a day (BID) | ORAL | 0 refills | Status: DC

## 2019-03-14 NOTE — Progress Notes (Signed)
There were no vitals taken for this visit.   Subjective:    Patient ID: Amber MeiersJulie M Santana, female    DOB: 04/28/1981, 38 y.o.   MRN: [id number]  HPI: Amber Santana is a 38 y.o. female  Chief Complaint  Patient presents with  . Rash    Seen Virtually through MyChart. Bactroban not working. Rash is spreading.    . This visit was completed via WebEx due to the restrictions of the COVID-19 pandemic. All issues as above were discussed and addressed. Physical exam was done as above through visual confirmation on WebEx. If it was felt that the patient should be evaluated in the office, they were directed there. The patient verbally consented to this visit. . Location of the patient: work . Location of the provider: home . Those involved with this call:  . Provider: Roosvelt Maserachel Jovan Colligan, PA-C . CMA: Myrtha MantisKeri Bullock, CMA . Front Desk/Registration: Harriet PhoJoliza Johnson  . Time spent on call: 15 minutes with patient face to face via video conference. More than 50% of this time was spent in counseling and coordination of care. 5 minutes total spent in review of patient's record and preparation of their chart. I verified patient identity using two factors (patient name and date of birth). Patient consents verbally to being seen via telemedicine visit today.   Patient presenting today for persistent rash that is not responding to bactroban given by evisit provider last week. Having lesions pop up in multiple different areas that blister and ooze some and become small ulcerations. Was exposed to impetigo recently. Areas itch slightly but are not painful. Using the bactroban daily without benefit. Denies fevers, chills, exposure to STIs or new allergens, recent travel. Currently her worst lesion is on right shoulder.   Relevant past medical, surgical, family and social history reviewed and updated as indicated. Interim medical history since our last visit reviewed. Allergies and medications reviewed and updated.   Review of Systems  Per HPI unless specifically indicated above     Objective:    There were no vitals taken for this visit.  Wt Readings from Last 3 Encounters:  02/06/19 143 lb (64.9 kg)  01/03/19 146 lb (66.2 kg)  08/12/18 160 lb (72.6 kg)    Physical Exam Vitals signs and nursing note reviewed.  Constitutional:      General: She is not in acute distress.    Appearance: Normal appearance.  HENT:     Head: Atraumatic.     Right Ear: External ear normal.     Left Ear: External ear normal.     Nose: Nose normal. No congestion.     Mouth/Throat:     Mouth: Mucous membranes are moist.     Pharynx: Oropharynx is clear. No posterior oropharyngeal erythema.  Eyes:     Extraocular Movements: Extraocular movements intact.     Conjunctiva/sclera: Conjunctivae normal.  Neck:     Musculoskeletal: Normal range of motion.  Cardiovascular:     Comments: Unable to assess via virtual visit Pulmonary:     Effort: Pulmonary effort is normal. No respiratory distress.  Musculoskeletal: Normal range of motion.  Skin:    General: Skin is dry.     Findings: Rash (crusted ulcerations on shoulder and upper back visualized via video technology. no surrounding erythema or active drainage) present. No erythema.  Neurological:     Mental Status: She is alert and oriented to person, place, and time.  Psychiatric:        Mood and  Affect: Mood normal.        Thought Content: Thought content normal.        Judgment: Judgment normal.     Results for orders placed or performed in visit on 02/06/19  WET PREP FOR [place], YEAST, CLUE   Specimen: Urine   URINE  Result Value Ref Range   Trichomonas Exam Negative Negative   Yeast Exam Positive (A) Negative   Clue Cell Exam Negative Negative  Microscopic Examination   URINE  Result Value Ref Range   WBC, UA >30 (A) 0 - 5 /hpf   RBC 0-2 0 - 2 /hpf   Epithelial Cells (non renal) 0-10 0 - 10 /hpf   Bacteria, UA Many (A) None seen/Few  Urine  Culture, Reflex   URINE  Result Value Ref Range   Urine Culture, Routine Final report (A)    Organism ID, Bacteria Escherichia coli (A)    Antimicrobial Susceptibility Comment   UA/M w/rflx Culture, Routine   Specimen: Urine   URINE  Result Value Ref Range   Specific Gravity, UA 1.025 1.005 - 1.030   pH, UA 6.0 5.0 - 7.5   Color, UA Yellow Yellow   Appearance Ur Cloudy (A) Clear   Leukocytes,UA 1+ (A) Negative   Protein,UA 1+ (A) Negative/Trace   Glucose, UA Negative Negative   Ketones, UA 1+ (A) Negative   RBC, UA 2+ (A) Negative   Bilirubin, UA Negative Negative   Urobilinogen, Ur 0.2 0.2 - 1.0 mg/dL   Nitrite, UA Positive (A) Negative   Microscopic Examination See below:    Urinalysis Reflex Comment       Assessment & Plan:   Problem List Items Addressed This Visit    None    Visit Diagnoses    Rash    -  Primary   Consistent appearance with impetigo, but continues to spread despite bactroban. Continue topical, add bactrim. Keep open areas covered. F/u if not improving       Follow up plan: Return if symptoms worsen or fail to improve.

## 2019-03-15 ENCOUNTER — Telehealth: Payer: Self-pay

## 2019-03-15 ENCOUNTER — Ambulatory Visit: Payer: No Typology Code available for payment source | Admitting: Family Medicine

## 2019-04-19 ENCOUNTER — Ambulatory Visit (INDEPENDENT_AMBULATORY_CARE_PROVIDER_SITE_OTHER): Payer: No Typology Code available for payment source

## 2019-04-19 ENCOUNTER — Other Ambulatory Visit: Payer: Self-pay

## 2019-04-19 DIAGNOSIS — Z23 Encounter for immunization: Secondary | ICD-10-CM

## 2019-04-19 DIAGNOSIS — Z3042 Encounter for surveillance of injectable contraceptive: Secondary | ICD-10-CM

## 2019-04-21 ENCOUNTER — Ambulatory Visit: Payer: No Typology Code available for payment source

## 2019-05-11 ENCOUNTER — Other Ambulatory Visit: Payer: Self-pay

## 2019-05-11 ENCOUNTER — Ambulatory Visit (INDEPENDENT_AMBULATORY_CARE_PROVIDER_SITE_OTHER): Payer: No Typology Code available for payment source | Admitting: Family Medicine

## 2019-05-11 ENCOUNTER — Encounter: Payer: Self-pay | Admitting: Family Medicine

## 2019-05-11 VITALS — Ht 63.5 in | Wt 134.0 lb

## 2019-05-11 DIAGNOSIS — F909 Attention-deficit hyperactivity disorder, unspecified type: Secondary | ICD-10-CM

## 2019-05-11 MED ORDER — AMPHETAMINE-DEXTROAMPHET ER 20 MG PO CP24: 20.0000 mg | ORAL_CAPSULE | ORAL | 0 refills | Status: AC

## 2019-05-11 NOTE — Progress Notes (Signed)
Ht 5' 3.5" (1.613 m)   Wt 134 lb (60.8 kg)   BMI 23.36 kg/m    Subjective:    Patient ID: Amber Santana, female    DOB: [birth year], 38 y.o.   MRN: [id number]  HPI: Amber Santana is a 38 y.o. female  Chief Complaint  Patient presents with  . ADHD    . This visit was completed via WebEx due to the restrictions of the COVID-19 pandemic. All issues as above were discussed and addressed. Physical exam was done as above through visual confirmation on WebEx. If it was felt that the patient should be evaluated in the office, they were directed there. The patient verbally consented to this visit. . Location of the patient: home . Location of the provider: work . Those involved with this call:  . Provider: Merrie Roof, PA-C . CMA: Tiffany Reel, CMA . Front Desk/Registration: Jill Side  . Time spent on call: 15 minutes with patient face to face via video conference. More than 50% of this time was spent in counseling and coordination of care. 5 minutes total spent in review of patient's record and preparation of their chart. I verified patient identity using two factors (patient name and date of birth). Patient consents verbally to being seen via telemedicine visit today.   Patient presents today for ADHD f/u. Taking 20 mg adderall XR which seems to be working very well to manage her focus issues and keeps her on task at work. Denies side effects, CP, SOB, palpitations, sleep or appetite issues.   Relevant past medical, surgical, family and social history reviewed and updated as indicated. Interim medical history since our last visit reviewed. Allergies and medications reviewed and updated.  Review of Systems  Per HPI unless specifically indicated above     Objective:    Ht 5' 3.5" (1.613 m)   Wt 134 lb (60.8 kg)   BMI 23.36 kg/m   Wt Readings from Last 3 Encounters:  05/11/19 134 lb (60.8 kg)  02/06/19 143 lb (64.9 kg)  01/03/19 146 lb (66.2 kg)    Physical Exam  Vitals signs and nursing note reviewed.  Constitutional:      General: She is not in acute distress.    Appearance: Normal appearance.  HENT:     Head: Atraumatic.     Right Ear: External ear normal.     Left Ear: External ear normal.     Nose: Nose normal. No congestion.     Mouth/Throat:     Mouth: Mucous membranes are moist.     Pharynx: Oropharynx is clear. No posterior oropharyngeal erythema.  Eyes:     Extraocular Movements: Extraocular movements intact.     Conjunctiva/sclera: Conjunctivae normal.  Neck:     Musculoskeletal: Normal range of motion.  Cardiovascular:     Comments: Unable to assess via virtual visit Pulmonary:     Effort: Pulmonary effort is normal. No respiratory distress.  Musculoskeletal: Normal range of motion.  Skin:    General: Skin is dry.     Findings: No erythema.  Neurological:     Mental Status: She is alert and oriented to person, place, and time.  Psychiatric:        Mood and Affect: Mood normal.        Thought Content: Thought content normal.        Judgment: Judgment normal.     Results for orders placed or performed in visit on 02/06/19  [place],  YEAST, CLUE   Specimen: Urine   URINE  Result Value Ref Range   Trichomonas Exam Negative Negative   Yeast Exam Positive (A) Negative   Clue Cell Exam Negative Negative  Microscopic Examination   URINE  Result Value Ref Range   WBC, UA >30 (A) 0 - 5 /hpf   RBC 0-2 0 - 2 /hpf   Epithelial Cells (non renal) 0-10 0 - 10 /hpf   Bacteria, UA Many (A) None seen/Few  Urine Culture, Reflex   URINE  Result Value Ref Range   Urine Culture, Routine Final report (A)    Organism ID, Bacteria Escherichia coli (A)    Antimicrobial Susceptibility Comment   UA/M w/rflx Culture, Routine   Specimen: Urine   URINE  Result Value Ref Range   Specific Gravity, UA 1.025 1.005 - 1.030   pH, UA 6.0 5.0 - 7.5   Color, UA Yellow Yellow   Appearance Ur Cloudy (A) Clear   Leukocytes,UA 1+ (A)  Negative   Protein,UA 1+ (A) Negative/Trace   Glucose, UA Negative Negative   Ketones, UA 1+ (A) Negative   RBC, UA 2+ (A) Negative   Bilirubin, UA Negative Negative   Urobilinogen, Ur 0.2 0.2 - 1.0 mg/dL   Nitrite, UA Positive (A) Negative   Microscopic Examination See below:    Urinalysis Reflex Comment       Assessment & Plan:   Problem List Items Addressed This Visit      Other   Adult ADHD (attention deficit hyperactivity disorder) - Primary (Chronic)    Stable and under good control, continue current regimen. Controlled substance database reviewed and appropriate          Follow up plan: Return in about 3 months (around 08/11/2019) for ADHD f/u.

## 2019-05-13 NOTE — Assessment & Plan Note (Signed)
Stable and under good control, continue current regimen. Controlled substance database reviewed and appropriate 

## 2019-06-27 ENCOUNTER — Encounter: Payer: Self-pay | Admitting: Family Medicine

## 2019-06-28 ENCOUNTER — Ambulatory Visit: Payer: No Typology Code available for payment source

## 2019-06-29 ENCOUNTER — Ambulatory Visit (INDEPENDENT_AMBULATORY_CARE_PROVIDER_SITE_OTHER): Payer: No Typology Code available for payment source

## 2019-06-29 ENCOUNTER — Other Ambulatory Visit: Payer: Self-pay

## 2019-06-29 ENCOUNTER — Ambulatory Visit: Payer: No Typology Code available for payment source

## 2019-06-29 DIAGNOSIS — Z3042 Encounter for surveillance of injectable contraceptive: Secondary | ICD-10-CM | POA: Diagnosis not present

## 2019-06-29 NOTE — Progress Notes (Signed)
Depo not due till December 23rd. Verbal given by Merrie Roof, PA-C to give Depo early, just this once.  Next Depo's will have to be given on time/when due and not early.   Next due: March 4th-March 18th 2021

## 2019-09-27 ENCOUNTER — Encounter: Payer: Self-pay | Admitting: Family Medicine

## 2019-09-29 ENCOUNTER — Other Ambulatory Visit: Payer: Self-pay

## 2019-09-29 ENCOUNTER — Ambulatory Visit (INDEPENDENT_AMBULATORY_CARE_PROVIDER_SITE_OTHER): Payer: No Typology Code available for payment source

## 2019-09-29 DIAGNOSIS — Z3042 Encounter for surveillance of injectable contraceptive: Secondary | ICD-10-CM

## 2019-09-29 LAB — PREGNANCY, URINE: Preg Test, Ur: NEGATIVE
